# Patient Record
Sex: Female | Born: 1935 | Race: White | Hispanic: No | Smoking: Never smoker
Health system: Southern US, Community
[De-identification: ages and names within clinical notes are randomized; demographics above are authoritative.]

## PROBLEM LIST (undated history)

## (undated) DIAGNOSIS — I82409 Acute embolism and thrombosis of unspecified deep veins of unspecified lower extremity: Secondary | ICD-10-CM

## (undated) DIAGNOSIS — C4490 Unspecified malignant neoplasm of skin, unspecified: Secondary | ICD-10-CM

## (undated) DIAGNOSIS — I4891 Unspecified atrial fibrillation: Secondary | ICD-10-CM

## (undated) DIAGNOSIS — F0280 Dementia in other diseases classified elsewhere without behavioral disturbance: Secondary | ICD-10-CM

## (undated) DIAGNOSIS — M1990 Unspecified osteoarthritis, unspecified site: Secondary | ICD-10-CM

## (undated) DIAGNOSIS — I519 Heart disease, unspecified: Secondary | ICD-10-CM

## (undated) DIAGNOSIS — I10 Essential (primary) hypertension: Secondary | ICD-10-CM

## (undated) DIAGNOSIS — C50919 Malignant neoplasm of unspecified site of unspecified female breast: Secondary | ICD-10-CM

## (undated) HISTORY — PX: FACIAL COSMETIC SURGERY: SHX629

## (undated) HISTORY — DX: Essential (primary) hypertension: I10

## (undated) HISTORY — DX: Acute embolism and thrombosis of unspecified deep veins of unspecified lower extremity: I82.409

## (undated) HISTORY — DX: Heart disease, unspecified: I51.9

## (undated) HISTORY — PX: MASTECTOMY: SHX3

## (undated) HISTORY — DX: Unspecified osteoarthritis, unspecified site: M19.90

## (undated) HISTORY — PX: ABDOMINAL HYSTERECTOMY: SUR658

## (undated) HISTORY — PX: TONSILLECTOMY: SUR1361

## (undated) HISTORY — DX: Unspecified atrial fibrillation: I48.91

## (undated) HISTORY — PX: APPENDECTOMY: SHX54

---

## 2004-08-13 ENCOUNTER — Ambulatory Visit: Payer: Self-pay | Admitting: Unknown Physician Specialty

## 2004-08-20 ENCOUNTER — Ambulatory Visit: Payer: Self-pay | Admitting: Unknown Physician Specialty

## 2004-08-26 ENCOUNTER — Ambulatory Visit: Payer: Self-pay | Admitting: Unknown Physician Specialty

## 2004-09-04 ENCOUNTER — Ambulatory Visit: Payer: Self-pay | Admitting: Unknown Physician Specialty

## 2004-10-28 ENCOUNTER — Ambulatory Visit: Payer: Self-pay | Admitting: Family Medicine

## 2005-03-27 ENCOUNTER — Other Ambulatory Visit: Payer: Self-pay

## 2005-03-27 ENCOUNTER — Emergency Department: Payer: Self-pay | Admitting: Internal Medicine

## 2005-08-18 ENCOUNTER — Ambulatory Visit: Payer: Self-pay | Admitting: Gastroenterology

## 2005-10-29 ENCOUNTER — Ambulatory Visit: Payer: Self-pay | Admitting: Family Medicine

## 2006-12-23 ENCOUNTER — Other Ambulatory Visit: Payer: Self-pay

## 2006-12-23 ENCOUNTER — Inpatient Hospital Stay: Payer: Self-pay | Admitting: Specialist

## 2007-02-27 ENCOUNTER — Emergency Department: Payer: Self-pay | Admitting: Emergency Medicine

## 2007-02-27 ENCOUNTER — Other Ambulatory Visit: Payer: Self-pay

## 2007-05-03 ENCOUNTER — Ambulatory Visit: Payer: Self-pay | Admitting: Family Medicine

## 2008-01-07 DIAGNOSIS — C50919 Malignant neoplasm of unspecified site of unspecified female breast: Secondary | ICD-10-CM

## 2008-01-07 HISTORY — DX: Malignant neoplasm of unspecified site of unspecified female breast: C50.919

## 2008-04-11 ENCOUNTER — Emergency Department: Payer: Self-pay | Admitting: Emergency Medicine

## 2008-05-23 ENCOUNTER — Ambulatory Visit: Payer: Self-pay | Admitting: Family Medicine

## 2008-06-06 ENCOUNTER — Ambulatory Visit: Payer: Self-pay | Admitting: Oncology

## 2008-06-13 ENCOUNTER — Ambulatory Visit: Payer: Self-pay | Admitting: Surgery

## 2008-06-20 ENCOUNTER — Ambulatory Visit: Payer: Self-pay | Admitting: Surgery

## 2008-07-03 ENCOUNTER — Ambulatory Visit: Payer: Self-pay | Admitting: Oncology

## 2008-07-06 ENCOUNTER — Ambulatory Visit: Payer: Self-pay | Admitting: Oncology

## 2008-07-14 ENCOUNTER — Ambulatory Visit: Payer: Self-pay | Admitting: Vascular Surgery

## 2008-08-06 ENCOUNTER — Ambulatory Visit: Payer: Self-pay | Admitting: Oncology

## 2008-09-06 ENCOUNTER — Ambulatory Visit: Payer: Self-pay | Admitting: Oncology

## 2008-10-06 ENCOUNTER — Ambulatory Visit: Payer: Self-pay | Admitting: Oncology

## 2008-11-06 ENCOUNTER — Ambulatory Visit: Payer: Self-pay | Admitting: Oncology

## 2008-12-06 ENCOUNTER — Ambulatory Visit: Payer: Self-pay | Admitting: Oncology

## 2009-01-06 ENCOUNTER — Ambulatory Visit: Payer: Self-pay | Admitting: Oncology

## 2009-02-06 ENCOUNTER — Ambulatory Visit: Payer: Self-pay | Admitting: Oncology

## 2009-03-06 ENCOUNTER — Ambulatory Visit: Payer: Self-pay | Admitting: Oncology

## 2009-03-08 ENCOUNTER — Ambulatory Visit: Payer: Self-pay | Admitting: Oncology

## 2009-04-06 ENCOUNTER — Ambulatory Visit: Payer: Self-pay | Admitting: Oncology

## 2009-06-06 ENCOUNTER — Ambulatory Visit: Payer: Self-pay | Admitting: Internal Medicine

## 2009-06-07 ENCOUNTER — Ambulatory Visit: Payer: Self-pay | Admitting: Oncology

## 2009-07-06 ENCOUNTER — Ambulatory Visit: Payer: Self-pay | Admitting: Oncology

## 2009-07-06 ENCOUNTER — Ambulatory Visit: Payer: Self-pay | Admitting: Internal Medicine

## 2009-09-06 ENCOUNTER — Ambulatory Visit: Payer: Self-pay | Admitting: Oncology

## 2009-09-11 ENCOUNTER — Ambulatory Visit: Payer: Self-pay | Admitting: Oncology

## 2009-10-06 ENCOUNTER — Ambulatory Visit: Payer: Self-pay | Admitting: Oncology

## 2009-11-21 ENCOUNTER — Observation Stay: Payer: Self-pay | Admitting: Internal Medicine

## 2009-12-03 ENCOUNTER — Ambulatory Visit: Payer: Self-pay | Admitting: Oncology

## 2010-03-12 ENCOUNTER — Ambulatory Visit: Payer: Self-pay | Admitting: Oncology

## 2010-03-14 ENCOUNTER — Ambulatory Visit: Payer: Self-pay | Admitting: Oncology

## 2010-04-07 ENCOUNTER — Ambulatory Visit: Payer: Self-pay | Admitting: Oncology

## 2010-08-29 ENCOUNTER — Ambulatory Visit: Payer: Self-pay | Admitting: Oncology

## 2010-09-07 ENCOUNTER — Ambulatory Visit: Payer: Self-pay | Admitting: Oncology

## 2010-12-05 ENCOUNTER — Ambulatory Visit: Payer: Self-pay | Admitting: Oncology

## 2011-03-02 IMAGING — CR DG CHEST 1V PORT
1 series · 1 of 1 positions shown · non-contrast
Comparison: none

REASON FOR EXAM: palpatations
COMMENTS:

[view not recorded]
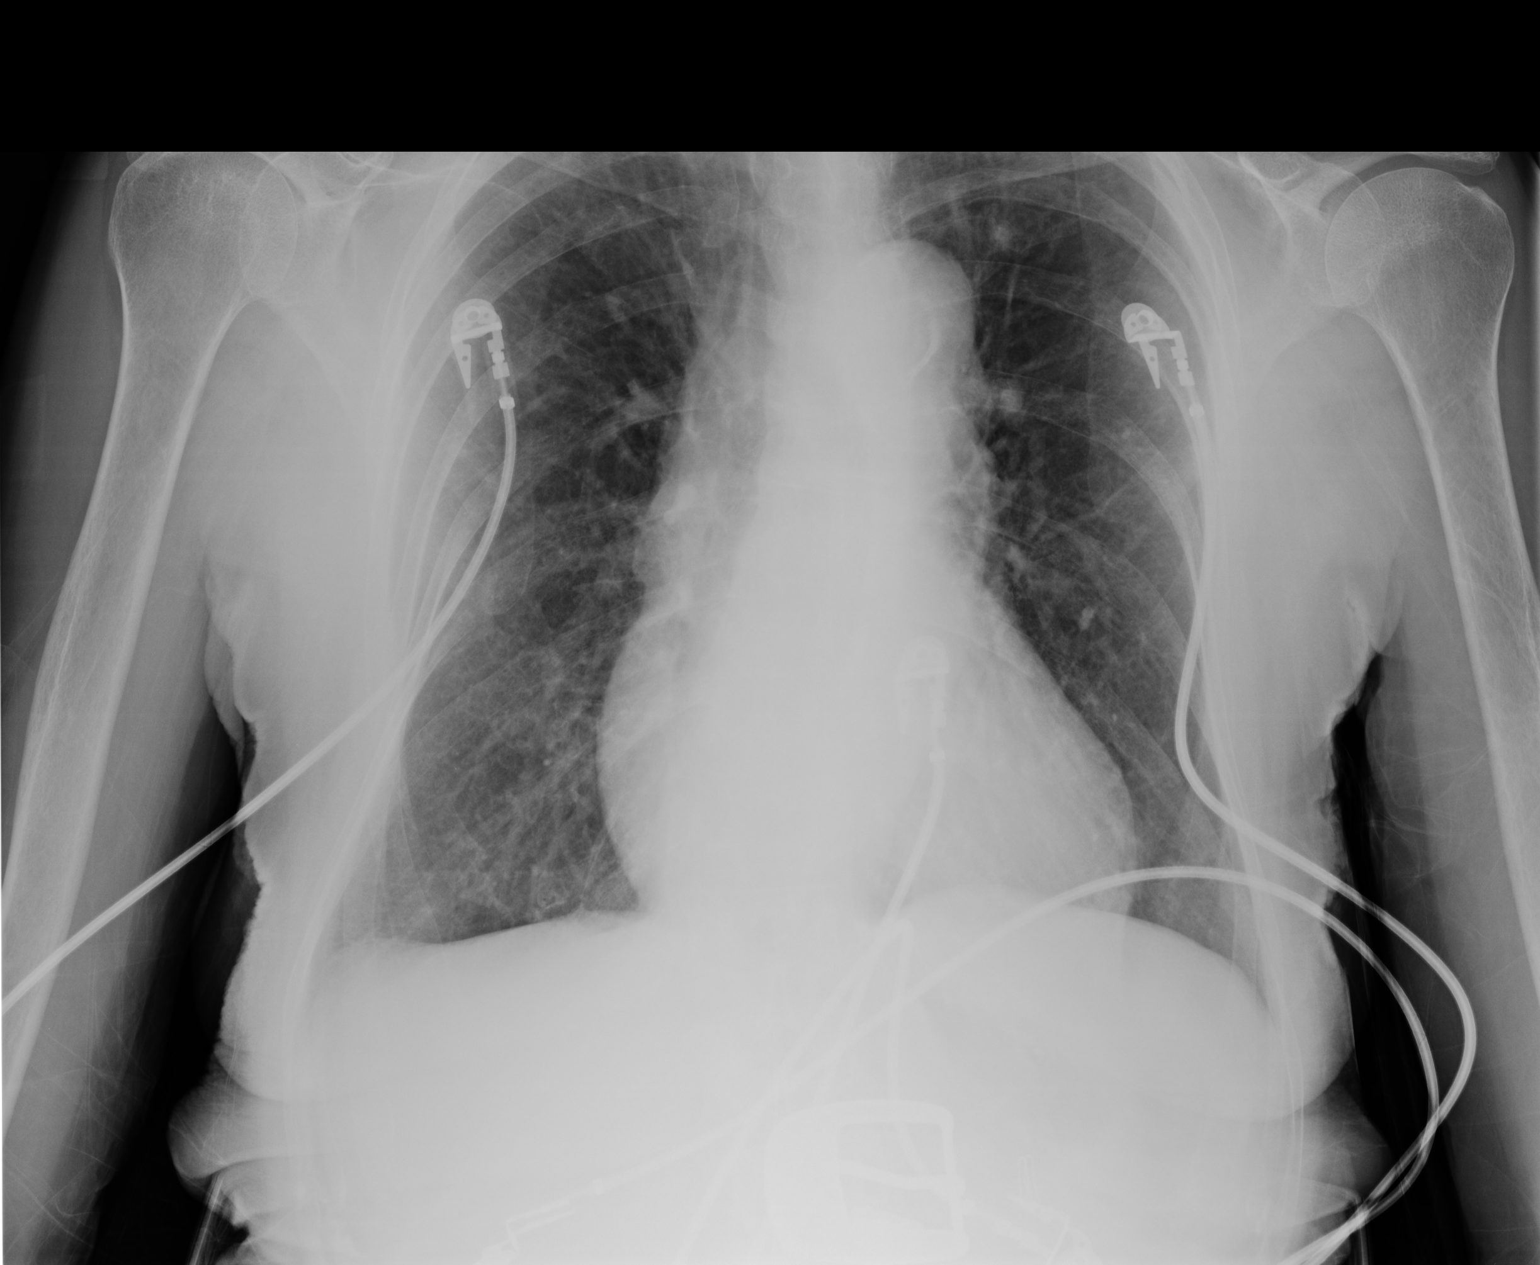

[1 of 1 positions shown; findings below may reference images not displayed]

PROCEDURE:     DXR - DXR PORTABLE CHEST SINGLE VIEW  - April 11, 2008  [DATE]

RESULT:     Comparison is made to the prior exam of 02/27/2007. There is
fibrotic change in the upper lobes bilaterally. There is thickening of the
interstitial lung markings suspicious for minimal interstitial edema. The
appearance suggests early or low grade CHF superimposed on COPD. Correlation
with clinical findings is needed. The heart is mildly enlarged but stable in
size as compared to the prior exam. Monitoring electrodes are present.
IMPRESSION: 1. No pneumonia is seen.
2. Possible early or low grade CHF superimposed on COPD.

## 2011-03-04 ENCOUNTER — Ambulatory Visit: Payer: Self-pay | Admitting: Oncology

## 2011-03-07 ENCOUNTER — Ambulatory Visit: Payer: Self-pay | Admitting: Oncology

## 2011-04-07 ENCOUNTER — Ambulatory Visit: Payer: Self-pay | Admitting: Oncology

## 2011-09-01 ENCOUNTER — Ambulatory Visit: Payer: Self-pay | Admitting: Oncology

## 2011-09-07 ENCOUNTER — Ambulatory Visit: Payer: Self-pay | Admitting: Oncology

## 2011-12-01 ENCOUNTER — Ambulatory Visit: Payer: Self-pay | Admitting: Family Medicine

## 2011-12-08 ENCOUNTER — Ambulatory Visit: Payer: Self-pay | Admitting: Oncology

## 2012-03-06 ENCOUNTER — Ambulatory Visit: Payer: Self-pay | Admitting: Oncology

## 2012-04-06 ENCOUNTER — Ambulatory Visit: Payer: Self-pay | Admitting: Oncology

## 2012-06-16 ENCOUNTER — Ambulatory Visit: Payer: Self-pay | Admitting: Surgery

## 2012-09-27 ENCOUNTER — Ambulatory Visit: Payer: Self-pay | Admitting: Oncology

## 2012-10-06 ENCOUNTER — Ambulatory Visit: Payer: Self-pay | Admitting: Oncology

## 2012-12-08 ENCOUNTER — Ambulatory Visit: Payer: Self-pay | Admitting: Oncology

## 2013-01-10 ENCOUNTER — Ambulatory Visit: Payer: Self-pay

## 2013-04-01 ENCOUNTER — Ambulatory Visit: Payer: Self-pay | Admitting: Oncology

## 2013-04-06 ENCOUNTER — Ambulatory Visit: Payer: Self-pay | Admitting: Oncology

## 2013-10-07 ENCOUNTER — Ambulatory Visit: Payer: Self-pay | Admitting: Oncology

## 2013-11-06 ENCOUNTER — Ambulatory Visit: Payer: Self-pay | Admitting: Oncology

## 2013-12-06 ENCOUNTER — Ambulatory Visit: Payer: Self-pay | Admitting: Oncology

## 2014-01-06 ENCOUNTER — Ambulatory Visit: Payer: Self-pay | Admitting: Oncology

## 2015-07-06 ENCOUNTER — Other Ambulatory Visit: Payer: Self-pay | Admitting: Family Medicine

## 2015-07-06 DIAGNOSIS — Z1231 Encounter for screening mammogram for malignant neoplasm of breast: Secondary | ICD-10-CM

## 2015-07-26 ENCOUNTER — Ambulatory Visit: Payer: Self-pay

## 2015-08-13 ENCOUNTER — Other Ambulatory Visit: Payer: Self-pay | Admitting: Family Medicine

## 2015-08-13 ENCOUNTER — Ambulatory Visit
Admission: RE | Admit: 2015-08-13 | Discharge: 2015-08-13 | Disposition: A | Discharge status: Home / Self Care | Payer: Medicare Other | Source: Ambulatory Visit | Attending: Family Medicine | Admitting: Family Medicine

## 2015-08-13 DIAGNOSIS — Z1231 Encounter for screening mammogram for malignant neoplasm of breast: Secondary | ICD-10-CM

## 2015-08-13 HISTORY — DX: Malignant neoplasm of unspecified site of unspecified female breast: C50.919

## 2015-08-13 HISTORY — DX: Unspecified malignant neoplasm of skin, unspecified: C44.90

## 2015-11-23 ENCOUNTER — Emergency Department
Admission: EM | Admit: 2015-11-23 | Discharge: 2015-11-23 | Disposition: A | Discharge status: Home / Self Care | Payer: Medicare Other | Attending: Emergency Medicine | Admitting: Emergency Medicine

## 2015-11-23 ENCOUNTER — Emergency Department: Payer: Medicare Other

## 2015-11-23 DIAGNOSIS — Z5181 Encounter for therapeutic drug level monitoring: Secondary | ICD-10-CM | POA: Diagnosis not present

## 2015-11-23 DIAGNOSIS — Z853 Personal history of malignant neoplasm of breast: Secondary | ICD-10-CM | POA: Diagnosis not present

## 2015-11-23 DIAGNOSIS — Z85828 Personal history of other malignant neoplasm of skin: Secondary | ICD-10-CM | POA: Diagnosis not present

## 2015-11-23 DIAGNOSIS — J069 Acute upper respiratory infection, unspecified: Secondary | ICD-10-CM | POA: Insufficient documentation

## 2015-11-23 DIAGNOSIS — R509 Fever, unspecified: Secondary | ICD-10-CM | POA: Diagnosis present

## 2015-11-23 LAB — CBC
HEMATOCRIT: 37.9 % (ref 35.0–47.0)
HEMOGLOBIN: 13.1 g/dL (ref 12.0–16.0)
MCH: 30.1 pg (ref 26.0–34.0)
MCHC: 34.6 g/dL (ref 32.0–36.0)
MCV: 86.8 fL (ref 80.0–100.0)
Platelets: 223 10*3/uL (ref 150–440)
RBC: 4.37 MIL/uL (ref 3.80–5.20)
RDW: 13.4 % (ref 11.5–14.5)
WBC: 6.5 10*3/uL (ref 3.6–11.0)

## 2015-11-23 LAB — BASIC METABOLIC PANEL
ANION GAP: 6 (ref 5–15)
BUN: 21 mg/dL — ABNORMAL HIGH (ref 6–20)
CHLORIDE: 103 mmol/L (ref 101–111)
CO2: 29 mmol/L (ref 22–32)
CREATININE: 0.87 mg/dL (ref 0.44–1.00)
Calcium: 9.3 mg/dL (ref 8.9–10.3)
GFR calc non Af Amer: 57 mL/min — ABNORMAL LOW (ref >60)
Glucose, Bld: 109 mg/dL — ABNORMAL HIGH (ref 65–99)
POTASSIUM: 4 mmol/L (ref 3.5–5.1)
SODIUM: 138 mmol/L (ref 135–145)

## 2015-11-23 LAB — PROTIME-INR
INR: 1.42
PROTHROMBIN TIME: 17.5 s — AB (ref 11.4–15.2)

## 2015-11-23 LAB — POCT RAPID STREP A: STREPTOCOCCUS, GROUP A SCREEN (DIRECT): NEGATIVE

## 2015-11-23 MED ORDER — AMOXICILLIN 500 MG PO CAPS
500.0000 mg | ORAL_CAPSULE | Freq: Once | ORAL | Status: AC
  Administered 2015-11-23: 500 mg via ORAL
  Filled 2015-11-23: qty 1

## 2015-11-23 MED ORDER — AMOXICILLIN 500 MG PO CAPS: 500.0000 mg | ORAL_CAPSULE | Freq: Three times a day (TID) | ORAL | 0 refills | Status: DC

## 2015-11-23 NOTE — ED Triage Notes (Signed)
Pt in with co sore throat and congestion since last Thursday. Pt also had bloody nose today, pt is on coumadin due to afib. Pt has also had a cough, no distress noted at this time.

## 2015-11-23 NOTE — Discharge Instructions (Signed)
1. Take antibiotic as prescribed (amoxicillin 500 mg 3 times daily 7 days). 2. Return to the ER for worsening symptoms, persistent vomiting, difficulty breathing or other concerns.

## 2015-11-23 NOTE — ED Provider Notes (Signed)
[facility] Emergency Department Provider Note   ____________________________________________   First MD Initiated Contact with Patient 11/23/15 [phone]     (approximate)  I have reviewed the triage vital signs and the nursing notes.   HISTORY  Chief Complaint Fever    HPI Jacqueline Larsen is a 82 y.o. female who presents to the ED from home with a chief complaint of cold-like symptoms. Patient reports sore throat, congestion, cough productive of yellow sputum 1 week. Also reports intermittent nosebleeds. Patient is on Coumadin for atrial fibrillation. Denies associated fever, chills, chest pain, shortness of breath, abdominal pain, nausea, vomiting, dysuria, diarrhea.Denies recent travel or trauma. + sick contacts. Nothing makes her symptoms better or worse.   Past Medical History:  Diagnosis Date  . Breast cancer ([place]) 2010   RT MASTECTOMY  . Skin cancer     There are no active problems to display for this patient.   No past surgical history on file.  Prior to Admission medications   Medication Sig Start Date End Date Taking? Authorizing Provider  amoxicillin (AMOXIL) 500 MG capsule Take 1 capsule (500 mg total) by mouth 3 (three) times daily. 11/23/15   Paulette Blanch, MD    Allergies Patient has no known allergies.  Family History  Problem Relation Age of Onset  . Breast cancer Mother 89  . Breast cancer Daughter     39'S    Social History Social History  Substance Use Topics  . Smoking status: Not on file  . Smokeless tobacco: Not on file  . Alcohol use Not on file    Review of Systems  Constitutional: No fever/chills. Eyes: No visual changes. ENT: Positive for sore throat. Cardiovascular: Denies chest pain. Respiratory: Positive for productive cough and congestion. Denies shortness of breath. Gastrointestinal: No abdominal pain.  No nausea, no vomiting.  No diarrhea.  No constipation. Genitourinary: Negative for  dysuria. Musculoskeletal: Negative for back pain. Skin: Negative for rash. Neurological: Negative for headaches, focal weakness or numbness.  10-point ROS otherwise negative.  ____________________________________________   PHYSICAL EXAM:  VITAL SIGNS: ED Triage Vitals [11/23/15 0046]  Enc Vitals Group     BP (!) 148/75     Pulse Rate 81     Resp 18     Temp 98.1 F (36.7 C)     Temp Source Oral     SpO2 98 %     Weight 115 lb (52.2 kg)     Height      Head Circumference      Peak Flow      Pain Score      Pain Loc      Pain Edu?      Excl. in [place]?     Constitutional: Alert and oriented. Well appearing and in no acute distress. Eyes: Conjunctivae are normal. PERRL. EOMI. Head: Atraumatic. Nose: Congestion/rhinnorhea. No bleeding. Mouth/Throat: Mucous membranes are moist.  Oropharynx mildly erythematous without tonsillar exudates, swelling or peritonsillar abscess. Neck: No stridor.  Supple neck without meningismus. Hematological/Lymphatic/Immunilogical: [place] anterior cervical lymphadenopathy. Cardiovascular: Normal rate, regular rhythm. Grossly normal heart sounds.  Good peripheral circulation. Respiratory: Normal respiratory effort.  No retractions. Lungs CTAB. No wheezing. Gastrointestinal: Soft and nontender. No distention. No abdominal bruits. No CVA tenderness. Musculoskeletal: No lower extremity tenderness nor edema.  No joint effusions. Neurologic:  Normal speech and language. No gross focal neurologic deficits are appreciated. No gait instability. Skin:  Skin is warm, dry and intact. No rash noted. Psychiatric:  Mood and affect are normal. Speech and behavior are normal.  ____________________________________________   LABS (all labs ordered are listed, but only abnormal results are displayed)  Labs Reviewed  BASIC METABOLIC PANEL - Abnormal; Notable for the following:       Result Value   Glucose, Bld 109 (*)    BUN 21 (*)    GFR calc non Af Amer 57 (*)     All other components within normal limits  PROTIME-INR - Abnormal; Notable for the following:    Prothrombin Time 17.5 (*)    All other components within normal limits  CBC  POCT RAPID STREP A   ____________________________________________  EKG  None ____________________________________________  RADIOLOGY  Chest 2 view (viewed by me, interpreted per Dr. Radene Knee): Findings of COPD. Lungs otherwise grossly clear. ____________________________________________   PROCEDURES  Procedure(s) performed: None  Procedures  Critical Care performed: No  ____________________________________________   INITIAL IMPRESSION / ASSESSMENT AND PLAN / ED COURSE  Pertinent labs & imaging results that were available during my care of the patient were reviewed by me and considered in my medical decision making (see chart for details).  82 year old female who presents with upper respiratory infection. She is afebrile with normal WBC, not tachypneic nor hypoxic. Does not meet SIRS or sepsis criteria. Will initiate treatment with amoxicillin and patient will follow-up closely with her PCP. Strict return precautions given. Patient and daughter verbalize understanding and agree with plan of care.  Clinical Course      ____________________________________________   FINAL CLINICAL IMPRESSION(S) / ED DIAGNOSES  Final diagnoses:  Upper respiratory tract infection, unspecified type      NEW MEDICATIONS STARTED DURING THIS VISIT:  Discharge Medication List as of 11/23/2015  5:56 AM    START taking these medications   Details  amoxicillin (AMOXIL) 500 MG capsule Take 1 capsule (500 mg total) by mouth 3 (three) times daily., Starting Fri 11/23/2015, Print         Note:  This document was prepared using Dragon voice recognition software and may include unintentional dictation errors.    Paulette Blanch, MD 11/23/15 [phone]

## 2016-06-24 NOTE — Progress Notes (Signed)
06/25/2016 9:05 AM   Jacqueline Larsen 01-Jul-1924 [id number]  Referring provider: Maryland Pink, MD [address] [facility] [place], [place] [address]  Chief Complaint  Patient presents with  . New Patient (Initial Visit)    urinary incontinence referred by Dr. Kary Kos    HPI: Patient is a 83 -year-old Caucasian female who is referred to [place] by, Dr. Kary Kos, for urinary incontinence with her daughter, Doloris.    Patient states that she has had urinary incontinence for one month and a half.    Patient has incontinence with stress and urge.   She is experiencing 3 incontinent episodes during the day. She is experiencing 1 to 2 incontinent episodes during the night.  Her incontinence volume is moderate.   She is wearing 3 pads/depends daily.    She is having associated urinary frequency x 4, urgency, nocturia x 1-2, intermittency and weak urinary stream.     She does not have a history of urinary tract infections, STI's or injury to the bladder.  She denies dysuria, gross hematuria, suprapubic pain, back pain, abdominal pain or flank pain.  She has not had any recent fevers, chills, nausea or vomiting.   She does not have a history of nephrolithiasis, GU surgery or GU trauma.   She is sexually active.  She is post menopausal.   She denies constipation and/or diarrhea.  She is not having pain with bladder filling.  She has not had any recent imaging studies.    She is drinking 2 bottles of water daily.   She is not drinking  caffeinated beverages daily.  She is not drinking alcoholic beverages daily.    Her risk factors for incontinence are age, caffeine, vaginal atrophy and  pelvic surgery,   She is taking antihistamines, benzo's, OAB medication, ACE inhibitors, alpha-agonists, alpha blockers, antiarrhythmics and diuretics.  Her PVR is 0 mL.    PMH: Past Medical History:  Diagnosis Date  . Arthritis   . Atrial fibrillation ([place])   . Breast cancer ([place]) 2010    RT MASTECTOMY  . DVT (deep venous thrombosis) ([place])   . Heart disease   . HTN (hypertension)   . Skin cancer     Surgical History: Past Surgical History:  Procedure Laterality Date  . ABDOMINAL HYSTERECTOMY    . APPENDECTOMY    . FACIAL COSMETIC SURGERY     removed nodule off face  . MASTECTOMY Right    Breast cancer  . TONSILLECTOMY      Home Medications:  Allergies as of 06/25/2016      Reactions   Clarithromycin Other (See Comments)   Doxycycline Other (See Comments)   Erythromycin Other (See Comments)   Sulindac Other (See Comments)      Medication List       Accurate as of 06/25/16 11:59 PM. Always use your most recent med list.          amoxicillin 500 MG capsule Commonly known as:  AMOXIL Take 1 capsule (500 mg total) by mouth 3 (three) times daily.   digoxin 0.125 MG tablet Commonly known as:  LANOXIN   diltiazem 120 MG 24 hr capsule Commonly known as:  CARDIZEM CD Take 120 mg by mouth.   donepezil 5 MG tablet Commonly known as:  ARICEPT Take 1 tablet once daily   levothyroxine 50 MCG tablet Commonly known as:  SYNTHROID, LEVOTHROID TAKE 1 TABLET BY MOUTH ONCE A DAY   lisinopril 10 MG tablet Commonly known  as:  PRINIVIL,ZESTRIL TAKE 1 TABLET BY MOUTH ONCE A DAY   meclizine 25 MG tablet Commonly known as:  ANTIVERT Take by mouth.   metoprolol succinate 25 MG 24 hr tablet Commonly known as:  TOPROL-XL Take by mouth.   metoprolol tartrate 25 MG tablet Commonly known as:  LOPRESSOR Take 25 mg by mouth.   oxybutynin 5 MG tablet Commonly known as:  DITROPAN Take by mouth.   Vitamin B-12 1000 MCG Subl Take by mouth.   warfarin 4 MG tablet Commonly known as:  COUMADIN TAKE 1 TABLET BY MOUTH EVERY NIGHT       Allergies:  Allergies  Allergen Reactions  . Clarithromycin Other (See Comments)  . Doxycycline Other (See Comments)  . Erythromycin Other (See Comments)  . Sulindac Other (See Comments)    Family History: Family  History  Problem Relation Age of Onset  . Breast cancer Mother 26  . Breast cancer Daughter        4'S  . Kidney cancer Neg Hx   . Bladder Cancer Neg Hx     Social History:  reports that she has never smoked. She has never used smokeless tobacco. She reports that she does not drink alcohol or use drugs.  ROS: UROLOGY Frequent Urination?: Yes Hard to postpone urination?: Yes Burning/pain with urination?: No Get up at night to urinate?: Yes Leakage of urine?: Yes Urine stream starts and stops?: Yes Trouble starting stream?: No Do you have to strain to urinate?: No Blood in urine?: No Urinary tract infection?: No Sexually transmitted disease?: No Injury to kidneys or bladder?: No Painful intercourse?: No Weak stream?: Yes Currently pregnant?: No Vaginal bleeding?: No Last menstrual period?: n  Gastrointestinal Nausea?: Yes Vomiting?: No Indigestion/heartburn?: Yes Diarrhea?: No Constipation?: No  Constitutional Fever: No Night sweats?: No Weight loss?: No Fatigue?: Yes  Skin Skin rash/lesions?: No Itching?: No  Eyes Blurred vision?: No Double vision?: No  Ears/Nose/Throat Sore throat?: No Sinus problems?: No  Hematologic/Lymphatic Swollen glands?: No Easy bruising?: Yes  Cardiovascular Leg swelling?: Yes Chest pain?: No  Respiratory Cough?: Yes Shortness of breath?: No  Endocrine Excessive thirst?: No  Musculoskeletal Back pain?: Yes Joint pain?: Yes  Neurological Headaches?: No Dizziness?: Yes  Psychologic Depression?: No Anxiety?: No  Physical Exam: BP 122/73   Pulse 80   Ht 5\' 5"  (1.651 m)   Wt 119 lb 11.2 oz (54.3 kg)   BMI 19.92 kg/m   Constitutional: Well nourished. Alert and oriented, No acute distress. HEENT: [place] AT, moist mucus membranes. Trachea midline, no masses. Cardiovascular: No clubbing, cyanosis, or edema. Respiratory: Normal respiratory effort, no increased work of breathing. GI: Abdomen is soft, non  tender, non distended, no abdominal masses. Liver and spleen not palpable.  No hernias appreciated.  Stool sample for occult testing is not indicated.   GU: No CVA tenderness.  No bladder fullness or masses.  Atrophic external genitalia, normal pubic hair distribution, no lesions.  Normal urethral meatus, no lesions, no prolapse, no discharge.   No urethral masses, tenderness and/or tenderness. No bladder fullness, tenderness or masses. Pale vagina mucosa, poor estrogen effect, no discharge, no lesions, good pelvic support, Grade I cystocele. No rectocele noted.  Cervix, uterus and adnexa are surgically absent. Anus and perineum are without rashes or lesions.    Skin: No rashes, bruises or suspicious lesions. Lymph: No cervical or inguinal adenopathy. Neurologic: Grossly intact, no focal deficits, moving all 4 extremities. Psychiatric: Normal mood and affect.  Laboratory Data: Lab Results  Component Value Date   WBC 6.5 11/23/2015   HGB 13.1 11/23/2015   HCT 37.9 11/23/2015   MCV 86.8 11/23/2015   PLT 223 11/23/2015    Lab Results  Component Value Date   CREATININE 0.87 11/23/2015      Pertinent Imaging: Results for MARGEE, TRENTHAM (MRN [id number]) as of 06/25/2016 14:29  Ref. Range 06/25/2016 14:14  Scan Result Unknown 0    Assessment & Plan:    1. Urge Incontinence  - Discussed behavioral therapies, bladder training and bladder control strategies  - pelvic floor muscle training - patient has memory issues so she is not a good candidate for PT  - offered medical therapy with anticholinergic therapy or beta-3 adrenergic receptor agonist and the potential side effects of each therapy - she is not wanting to take medication for her incontinence  - offered PTNS therapy  - explained the PTNS provides treatment by indirectly providing electrical stimulation to the nerves responsible for bladder and pelvic floor function - a needle electrode generates an adjustable electrical pulse  that travels to the sacral plexus via the tibial nerve which is located in the ankle, among other functions, the sacral nerve plexus regulates bladder and pelvic floor function - treatment protocol requires once-a-week treatments for 12 weeks, 30 minutes per session and many patients begin to see improvements by the 6th treatment. Patients who respond to treatment may require occasional treatments (~ once every 3 weeks) to sustain improvements. PTNS is a low-risk procedure. The most common side-effects with PTNS treatment are temporary and minor, resulting from the placement of the needle electrode. They include minor bleeding, mild pain and skin inflammation and patients have seen up to an 80% success rate with this form of treatment  - After researching her insurance she finds her co-pays cost prohibitive  - They will try Myrbetriq 50 mg samples  - RTC in 3 weeks for OAB questionnaire and PVR  2. Vaginal atrophy  - Patient does not want to use products containing estrogen as she has a history of breast cancer  Return in about 3 weeks (around 07/16/2016) for PVR and OAB questionnaire.  These notes generated with voice recognition software. I apologize for typographical errors.  Zara Council, [facility] [address], [place], [place] [address] [phone]

## 2016-06-25 ENCOUNTER — Ambulatory Visit (INDEPENDENT_AMBULATORY_CARE_PROVIDER_SITE_OTHER): Payer: Medicare Other | Admitting: Urology

## 2016-06-25 ENCOUNTER — Telehealth: Payer: Self-pay | Admitting: *Deleted

## 2016-06-25 ENCOUNTER — Encounter: Payer: Self-pay | Admitting: Urology

## 2016-06-25 VITALS — BP 122/73 | HR 80 | Ht 65.0 in | Wt 119.7 lb

## 2016-06-25 DIAGNOSIS — N952 Postmenopausal atrophic vaginitis: Secondary | ICD-10-CM

## 2016-06-25 DIAGNOSIS — N3941 Urge incontinence: Secondary | ICD-10-CM | POA: Diagnosis not present

## 2016-06-25 LAB — BLADDER SCAN AMB NON-IMAGING: Scan Result: 0

## 2016-06-25 NOTE — Progress Notes (Signed)
Spoke with patient in regards to Larene Beach wanting to try PTNS procedure. Patient states this would not work with her budget. She states she can't pay 50.00 co payment every week for twelve weeks. Zara Council PA-C informed.

## 2016-06-25 NOTE — Telephone Encounter (Signed)
Called patient to offer her Myrbetriq 51 after patient can't do PTNS because of cost. Larene Beach states patient can have samples for one month and if she does she will need an appointment made for 3 weeks for follow up.

## 2016-06-26 NOTE — Telephone Encounter (Signed)
Patients daughter called I gave her Shannons message and they would like to try the myrbetriq. Patient will set up three week appointment tomorrow when picking up samples.

## 2016-07-21 NOTE — Progress Notes (Signed)
07/22/2016 4:51 PM   Jacqueline Larsen 06/01/1924 [id number]  Referring provider: Maryland Pink, MD [address] [facility] [place], [place] [address]  Chief Complaint  Patient presents with  . Urinary Incontinence    Urge  3 week follow up     HPI: 83 yo WF who presents today for a 3 week follow up.  Background history Patient is a 73 -year-old Caucasian female who is referred to [place] by, Dr. Kary Kos, for urinary incontinence with her daughter, Rudene Re.  Patient states that she has had urinary incontinence for one month and a half.  Patient has incontinence with stress and urge.   She is experiencing 3 incontinent episodes during the day. She is experiencing 1 to 2 incontinent episodes during the night.  Her incontinence volume is moderate.   She is wearing 3 pads/depends daily.  She is having associated urinary frequency x 4, urgency, nocturia x 1-2, intermittency and weak urinary stream.   She does not have a history of urinary tract infections, STI's or injury to the bladder.  She denies dysuria, gross hematuria, suprapubic pain, back pain, abdominal pain or flank pain.  She has not had any recent fevers, chills, nausea or vomiting.   She does not have a history of nephrolithiasis, GU surgery or GU trauma.   She is sexually active.  She is post menopausal.  She denies constipation and/or diarrhea.  She is not having pain with bladder filling.  She has not had any recent imaging studies.  She is drinking 2 bottles of water daily.   She is not drinking  caffeinated beverages daily.  She is not drinking alcoholic beverages daily.  Her risk factors for incontinence are age, caffeine, vaginal atrophy and  pelvic surgery, She is taking antihistamines, benzo's, OAB medication, ACE inhibitors, alpha-agonists, alpha blockers, antiarrhythmics and diuretics.  Her PVR is 0 mL.    She was interested in PTNS therapy, but she could not afford the co-payments.  She was then given samples of  Myrbetriq 50 mg.  The patient has been experiencing urgency x 4-7, frequency x 4-7, not restricting fluids to avoid visits to the restroom, is engaging in toilet mapping, incontinence x 4-7  and nocturia x 0-3.  She is not having fevers, chills, nausea and vomiting.  She is not having gross hematuria, suprapubic pain or dysuria.  Her PVR was 0 mL.      She states she did not find the Myrbetriq effective, but she did note that the pain before urination eased off.  It has returned last night.  Her UA was unremarkable at today visit.    PMH: Past Medical History:  Diagnosis Date  . Arthritis   . Atrial fibrillation ([place])   . Breast cancer ([place]) 2010   RT MASTECTOMY  . DVT (deep venous thrombosis) ([place])   . Heart disease   . HTN (hypertension)   . Skin cancer     Surgical History: Past Surgical History:  Procedure Laterality Date  . ABDOMINAL HYSTERECTOMY    . APPENDECTOMY    . FACIAL COSMETIC SURGERY     removed nodule off face  . MASTECTOMY Right    Breast cancer  . TONSILLECTOMY      Home Medications:  Allergies as of 07/22/2016      Reactions   Clarithromycin Other (See Comments)   Doxycycline Other (See Comments)   Erythromycin Other (See Comments)   Sulindac Other (See Comments)  Medication List       Accurate as of 07/22/16  4:51 PM. Always use your most recent med list.          amoxicillin 500 MG capsule Commonly known as:  AMOXIL Take 1 capsule (500 mg total) by mouth 3 (three) times daily.   digoxin 0.125 MG tablet Commonly known as:  LANOXIN   diltiazem 120 MG 24 hr capsule Commonly known as:  CARDIZEM CD Take 120 mg by mouth.   donepezil 5 MG tablet Commonly known as:  ARICEPT Take 1 tablet once daily   levothyroxine 50 MCG tablet Commonly known as:  SYNTHROID, LEVOTHROID TAKE 1 TABLET BY MOUTH ONCE A DAY   lisinopril 10 MG tablet Commonly known as:  PRINIVIL,ZESTRIL TAKE 1 TABLET BY MOUTH ONCE A DAY   meclizine 25 MG tablet Commonly  known as:  ANTIVERT Take by mouth.   metoprolol succinate 25 MG 24 hr tablet Commonly known as:  TOPROL-XL Take by mouth.   metoprolol tartrate 25 MG tablet Commonly known as:  LOPRESSOR Take 25 mg by mouth.   mirabegron ER 50 MG Tb24 tablet Commonly known as:  MYRBETRIQ Take 1 tablet (50 mg total) by mouth daily.   oxybutynin 5 MG tablet Commonly known as:  DITROPAN Take by mouth.   Vitamin B-12 1000 MCG Subl Take by mouth.   warfarin 4 MG tablet Commonly known as:  COUMADIN TAKE 1 TABLET BY MOUTH EVERY NIGHT       Allergies:  Allergies  Allergen Reactions  . Clarithromycin Other (See Comments)  . Doxycycline Other (See Comments)  . Erythromycin Other (See Comments)  . Sulindac Other (See Comments)    Family History: Family History  Problem Relation Age of Onset  . Breast cancer Mother 86  . Breast cancer Daughter        38'S  . Kidney cancer Neg Hx   . Bladder Cancer Neg Hx     Social History:  reports that she has never smoked. She has never used smokeless tobacco. She reports that she does not drink alcohol or use drugs.  ROS: UROLOGY Frequent Urination?: No Hard to postpone urination?: Yes Burning/pain with urination?: No Get up at night to urinate?: Yes Leakage of urine?: Yes Urine stream starts and stops?: Yes Trouble starting stream?: Yes Do you have to strain to urinate?: No Blood in urine?: No Urinary tract infection?: No Sexually transmitted disease?: No Injury to kidneys or bladder?: No Painful intercourse?: No Weak stream?: Yes Currently pregnant?: No Vaginal bleeding?: No Last menstrual period?: n  Gastrointestinal Nausea?: No Vomiting?: No Indigestion/heartburn?: No Diarrhea?: No Constipation?: No  Constitutional Fever: No Night sweats?: No Weight loss?: No Fatigue?: Yes  Skin Skin rash/lesions?: No Itching?: Yes  Eyes Blurred vision?: No Double vision?: No  Ears/Nose/Throat Sore throat?: No Sinus problems?:  No  Hematologic/Lymphatic Swollen glands?: No Easy bruising?: Yes  Cardiovascular Leg swelling?: No Chest pain?: No  Respiratory Cough?: No Shortness of breath?: No  Endocrine Excessive thirst?: No  Musculoskeletal Back pain?: Yes Joint pain?: Yes  Neurological Headaches?: No Dizziness?: No  Psychologic Depression?: No Anxiety?: No  Physical Exam: BP 133/77   Pulse (!) 45   Ht 5\' 5"  (1.651 m)   Wt 118 lb 6.4 oz (53.7 kg)   BMI 19.70 kg/m   Constitutional: Well nourished. Alert and oriented, No acute distress. HEENT: [place] AT, moist mucus membranes. Trachea midline, no masses. Cardiovascular: No clubbing, cyanosis, or edema. Respiratory: Normal respiratory effort, no increased  work of breathing. GI: Abdomen is soft, non tender, non distended, no abdominal masses. Liver and spleen not palpable.  No hernias appreciated.  Stool sample for occult testing is not indicated.   GU: No CVA tenderness.  No bladder fullness or masses.  Atrophic external genitalia, normal pubic hair distribution, no lesions.  Normal urethral meatus, no lesions, no prolapse, no discharge.   No urethral masses, tenderness and/or tenderness. No bladder fullness, tenderness or masses. Pale vagina mucosa, poor estrogen effect, no discharge, no lesions, good pelvic support, Grade I cystocele. No rectocele noted.  Cervix, uterus and adnexa are surgically absent. Anus and perineum are without rashes or lesions.    Skin: No rashes, bruises or suspicious lesions. Lymph: No cervical or inguinal adenopathy. Neurologic: Grossly intact, no focal deficits, moving all 4 extremities. Psychiatric: Normal mood and affect.  Laboratory Data: Lab Results  Component Value Date   WBC 6.5 11/23/2015   HGB 13.1 11/23/2015   HCT 37.9 11/23/2015   MCV 86.8 11/23/2015   PLT 223 11/23/2015    Lab Results  Component Value Date   CREATININE 0.87 11/23/2015   I have reviewed the labs   Pertinent Imaging: Results  for KYLINA, VULTAGGIO (MRN [id number]) as of 07/22/2016 15:50  Ref. Range 07/22/2016 15:37  Scan Result Unknown 0    Assessment & Plan:    1. Urge Incontinence  - We discussed continuing the Mrybetriq 50 mg daily, changing to an anticholinergic or adding an anticholinergic to the Myrbetriq  - She would like to give the Myrbetriq more time and will continue the medication for 3 more months  - She will return in 3 months for OAB questionnaire and PVR  2. Dysuria  - patient's UA is negative - will send for culture for completeness   - will reassess when she returns in 3 months    3. Vaginal atrophy  - Patient does not want to use products containing estrogen as she has a history of breast cancer  Return in about 3 months (around 10/22/2016) for PVR and OAB questionnaire.  These notes generated with voice recognition software. I apologize for typographical errors.  Zara Council, [facility] [address], [place],  [address] [phone]

## 2016-07-22 ENCOUNTER — Encounter: Payer: Self-pay | Admitting: Urology

## 2016-07-22 ENCOUNTER — Ambulatory Visit (INDEPENDENT_AMBULATORY_CARE_PROVIDER_SITE_OTHER): Payer: Medicare Other | Admitting: Urology

## 2016-07-22 VITALS — BP 133/77 | HR 45 | Ht 65.0 in | Wt 118.4 lb

## 2016-07-22 DIAGNOSIS — N952 Postmenopausal atrophic vaginitis: Secondary | ICD-10-CM | POA: Diagnosis not present

## 2016-07-22 DIAGNOSIS — R300 Dysuria: Secondary | ICD-10-CM

## 2016-07-22 DIAGNOSIS — N3941 Urge incontinence: Secondary | ICD-10-CM | POA: Diagnosis not present

## 2016-07-22 LAB — BLADDER SCAN AMB NON-IMAGING: Scan Result: 0

## 2016-07-22 MED ORDER — MIRABEGRON ER 50 MG PO TB24
50.0000 mg | ORAL_TABLET | Freq: Every day | ORAL | 11 refills | Status: DC
Start: 2016-07-22 — End: 2017-02-11

## 2016-07-23 LAB — URINALYSIS, COMPLETE
BILIRUBIN UA: NEGATIVE
GLUCOSE, UA: NEGATIVE
KETONES UA: NEGATIVE
LEUKOCYTES UA: NEGATIVE
Nitrite, UA: NEGATIVE
RBC UA: NEGATIVE
Urobilinogen, Ur: 0.2 mg/dL (ref 0.2–1.0)
pH, UA: 6 (ref 5.0–7.5)

## 2016-07-23 LAB — MICROSCOPIC EXAMINATION

## 2016-07-26 LAB — CULTURE, URINE COMPREHENSIVE

## 2016-10-23 ENCOUNTER — Ambulatory Visit: Payer: Medicare Other | Admitting: Urology

## 2017-02-11 ENCOUNTER — Emergency Department: Payer: Medicare Other

## 2017-02-11 ENCOUNTER — Observation Stay
Admission: EM | Admit: 2017-02-11 | Discharge: 2017-02-11 | Disposition: A | Discharge status: Home / Self Care | Payer: Medicare Other | Attending: Internal Medicine | Admitting: Internal Medicine

## 2017-02-11 ENCOUNTER — Encounter: Payer: Self-pay | Admitting: Emergency Medicine

## 2017-02-11 ENCOUNTER — Other Ambulatory Visit: Payer: Self-pay

## 2017-02-11 DIAGNOSIS — Z85828 Personal history of other malignant neoplasm of skin: Secondary | ICD-10-CM | POA: Diagnosis not present

## 2017-02-11 DIAGNOSIS — I119 Hypertensive heart disease without heart failure: Secondary | ICD-10-CM | POA: Insufficient documentation

## 2017-02-11 DIAGNOSIS — Z79899 Other long term (current) drug therapy: Secondary | ICD-10-CM | POA: Diagnosis not present

## 2017-02-11 DIAGNOSIS — Z886 Allergy status to analgesic agent status: Secondary | ICD-10-CM | POA: Insufficient documentation

## 2017-02-11 DIAGNOSIS — Z79890 Hormone replacement therapy (postmenopausal): Secondary | ICD-10-CM | POA: Insufficient documentation

## 2017-02-11 DIAGNOSIS — Z853 Personal history of malignant neoplasm of breast: Secondary | ICD-10-CM | POA: Diagnosis not present

## 2017-02-11 DIAGNOSIS — Z86718 Personal history of other venous thrombosis and embolism: Secondary | ICD-10-CM | POA: Insufficient documentation

## 2017-02-11 DIAGNOSIS — Z9049 Acquired absence of other specified parts of digestive tract: Secondary | ICD-10-CM | POA: Insufficient documentation

## 2017-02-11 DIAGNOSIS — J101 Influenza due to other identified influenza virus with other respiratory manifestations: Secondary | ICD-10-CM | POA: Diagnosis present

## 2017-02-11 DIAGNOSIS — M1990 Unspecified osteoarthritis, unspecified site: Secondary | ICD-10-CM | POA: Diagnosis not present

## 2017-02-11 DIAGNOSIS — R4182 Altered mental status, unspecified: Secondary | ICD-10-CM | POA: Diagnosis present

## 2017-02-11 DIAGNOSIS — Z90710 Acquired absence of both cervix and uterus: Secondary | ICD-10-CM | POA: Diagnosis not present

## 2017-02-11 DIAGNOSIS — Z7901 Long term (current) use of anticoagulants: Secondary | ICD-10-CM | POA: Insufficient documentation

## 2017-02-11 DIAGNOSIS — Z803 Family history of malignant neoplasm of breast: Secondary | ICD-10-CM | POA: Insufficient documentation

## 2017-02-11 DIAGNOSIS — J111 Influenza due to unidentified influenza virus with other respiratory manifestations: Principal | ICD-10-CM | POA: Insufficient documentation

## 2017-02-11 DIAGNOSIS — Z9011 Acquired absence of right breast and nipple: Secondary | ICD-10-CM | POA: Diagnosis not present

## 2017-02-11 DIAGNOSIS — R2681 Unsteadiness on feet: Secondary | ICD-10-CM | POA: Insufficient documentation

## 2017-02-11 DIAGNOSIS — Z9181 History of falling: Secondary | ICD-10-CM | POA: Insufficient documentation

## 2017-02-11 DIAGNOSIS — I4891 Unspecified atrial fibrillation: Secondary | ICD-10-CM | POA: Diagnosis not present

## 2017-02-11 DIAGNOSIS — Z881 Allergy status to other antibiotic agents status: Secondary | ICD-10-CM | POA: Diagnosis not present

## 2017-02-11 DIAGNOSIS — R262 Difficulty in walking, not elsewhere classified: Secondary | ICD-10-CM | POA: Diagnosis not present

## 2017-02-11 LAB — INFLUENZA PANEL BY PCR (TYPE A & B)
Influenza A By PCR: POSITIVE — AB
Influenza B By PCR: NEGATIVE

## 2017-02-11 LAB — CBC
HCT: 37.5 % (ref 35.0–47.0)
Hemoglobin: 12.6 g/dL (ref 12.0–16.0)
MCH: 29.6 pg (ref 26.0–34.0)
MCHC: 33.5 g/dL (ref 32.0–36.0)
MCV: 88.1 fL (ref 80.0–100.0)
PLATELETS: 161 10*3/uL (ref 150–440)
RBC: 4.25 MIL/uL (ref 3.80–5.20)
RDW: 14.1 % (ref 11.5–14.5)
WBC: 4.3 10*3/uL (ref 3.6–11.0)

## 2017-02-11 LAB — URINALYSIS, ROUTINE W REFLEX MICROSCOPIC
Bilirubin Urine: NEGATIVE
Glucose, UA: NEGATIVE mg/dL
KETONES UR: NEGATIVE mg/dL
Nitrite: NEGATIVE
PH: 5 (ref 5.0–8.0)
PROTEIN: 30 mg/dL — AB
Specific Gravity, Urine: 1.024 (ref 1.005–1.030)

## 2017-02-11 LAB — BASIC METABOLIC PANEL
Anion gap: 10 (ref 5–15)
BUN: 25 mg/dL — ABNORMAL HIGH (ref 6–20)
CALCIUM: 9 mg/dL (ref 8.9–10.3)
CHLORIDE: 102 mmol/L (ref 101–111)
CO2: 25 mmol/L (ref 22–32)
CREATININE: 0.9 mg/dL (ref 0.44–1.00)
GFR calc Af Amer: >60 mL/min (ref >60)
GFR calc non Af Amer: 55 mL/min — ABNORMAL LOW (ref >60)
Glucose, Bld: 104 mg/dL — ABNORMAL HIGH (ref 65–99)
Potassium: 3.6 mmol/L (ref 3.5–5.1)
Sodium: 137 mmol/L (ref 135–145)

## 2017-02-11 LAB — COMPREHENSIVE METABOLIC PANEL
ALBUMIN: 4 g/dL (ref 3.5–5.0)
ALK PHOS: 64 U/L (ref 38–126)
ALT: 22 U/L (ref 14–54)
AST: 37 U/L (ref 15–41)
Anion gap: 12 (ref 5–15)
BILIRUBIN TOTAL: 0.6 mg/dL (ref 0.3–1.2)
BUN: 24 mg/dL — ABNORMAL HIGH (ref 6–20)
CALCIUM: 9.3 mg/dL (ref 8.9–10.3)
CO2: 25 mmol/L (ref 22–32)
Chloride: 98 mmol/L — ABNORMAL LOW (ref 101–111)
Creatinine, Ser: 1.02 mg/dL — ABNORMAL HIGH (ref 0.44–1.00)
GFR calc Af Amer: 54 mL/min — ABNORMAL LOW (ref >60)
GFR calc non Af Amer: 47 mL/min — ABNORMAL LOW (ref >60)
GLUCOSE: 105 mg/dL — AB (ref 65–99)
Potassium: 3.9 mmol/L (ref 3.5–5.1)
Sodium: 135 mmol/L (ref 135–145)
TOTAL PROTEIN: 7.5 g/dL (ref 6.5–8.1)

## 2017-02-11 LAB — CBC WITH DIFFERENTIAL/PLATELET
BASOS ABS: 0 10*3/uL (ref 0–0.1)
BASOS PCT: 0 %
EOS ABS: 0 10*3/uL (ref 0–0.7)
EOS PCT: 0 %
HEMATOCRIT: 40.3 % (ref 35.0–47.0)
Hemoglobin: 13.5 g/dL (ref 12.0–16.0)
Lymphocytes Relative: 16 %
Lymphs Abs: 0.8 10*3/uL — ABNORMAL LOW (ref 1.0–3.6)
MCH: 29.5 pg (ref 26.0–34.0)
MCHC: 33.6 g/dL (ref 32.0–36.0)
MCV: 88 fL (ref 80.0–100.0)
MONO ABS: 1 10*3/uL — AB (ref 0.2–0.9)
Monocytes Relative: 18 %
NEUTROS ABS: 3.4 10*3/uL (ref 1.4–6.5)
Neutrophils Relative %: 66 %
PLATELETS: 178 10*3/uL (ref 150–440)
RBC: 4.59 MIL/uL (ref 3.80–5.20)
RDW: 13.9 % (ref 11.5–14.5)
WBC: 5.2 10*3/uL (ref 3.6–11.0)

## 2017-02-11 LAB — PROTIME-INR
INR: 1.49
PROTHROMBIN TIME: 17.9 s — AB (ref 11.4–15.2)

## 2017-02-11 LAB — LACTIC ACID, PLASMA: Lactic Acid, Venous: 1.9 mmol/L (ref 0.5–1.9)

## 2017-02-11 MED ORDER — SODIUM CHLORIDE 0.9 % IV SOLN
INTRAVENOUS | Status: DC
  Administered 2017-02-11: 06:00:00 via INTRAVENOUS

## 2017-02-11 MED ORDER — LEVOTHYROXINE SODIUM 50 MCG PO TABS
50.0000 ug | ORAL_TABLET | Freq: Every day | ORAL | Status: DC
  Administered 2017-02-11: 50 ug via ORAL
  Filled 2017-02-11: qty 1

## 2017-02-11 MED ORDER — BENZONATATE 100 MG PO CAPS
100.0000 mg | ORAL_CAPSULE | Freq: Three times a day (TID) | ORAL | Status: DC
  Administered 2017-02-11: 100 mg via ORAL
  Filled 2017-02-11: qty 1

## 2017-02-11 MED ORDER — ONDANSETRON HCL 4 MG PO TABS: 4.0000 mg | ORAL_TABLET | Freq: Four times a day (QID) | ORAL | Status: DC | PRN

## 2017-02-11 MED ORDER — ACETAMINOPHEN 325 MG PO TABS: 650.0000 mg | ORAL_TABLET | Freq: Four times a day (QID) | ORAL | Status: DC | PRN

## 2017-02-11 MED ORDER — ACETAMINOPHEN 650 MG RE SUPP
650.0000 mg | Freq: Four times a day (QID) | RECTAL | Status: DC | PRN
Start: 2017-02-11 — End: 2017-02-11

## 2017-02-11 MED ORDER — OSELTAMIVIR PHOSPHATE 30 MG PO CAPS
30.0000 mg | ORAL_CAPSULE | Freq: Two times a day (BID) | ORAL | Status: DC
  Administered 2017-02-11: 30 mg via ORAL
  Filled 2017-02-11 (×2): qty 1

## 2017-02-11 MED ORDER — OSELTAMIVIR PHOSPHATE 30 MG PO CAPS: 30.0000 mg | ORAL_CAPSULE | Freq: Two times a day (BID) | ORAL | 0 refills | Status: AC

## 2017-02-11 MED ORDER — OSELTAMIVIR PHOSPHATE 30 MG PO CAPS: 30.0000 mg | ORAL_CAPSULE | Freq: Two times a day (BID) | ORAL | Status: DC

## 2017-02-11 MED ORDER — OXYBUTYNIN CHLORIDE 5 MG PO TABS
5.0000 mg | ORAL_TABLET | Freq: Two times a day (BID) | ORAL | Status: DC
  Administered 2017-02-11: 5 mg via ORAL
  Filled 2017-02-11: qty 1

## 2017-02-11 MED ORDER — WARFARIN SODIUM 5 MG PO TABS
5.0000 mg | ORAL_TABLET | Freq: Once | ORAL | Status: DC
  Filled 2017-02-11: qty 1

## 2017-02-11 MED ORDER — DONEPEZIL HCL 5 MG PO TABS
5.0000 mg | ORAL_TABLET | Freq: Every day | ORAL | Status: DC
  Administered 2017-02-11: 5 mg via ORAL
  Filled 2017-02-11: qty 1

## 2017-02-11 MED ORDER — OSELTAMIVIR PHOSPHATE 75 MG PO CAPS
75.0000 mg | ORAL_CAPSULE | Freq: Once | ORAL | Status: DC
Start: 2017-02-11 — End: 2017-02-11

## 2017-02-11 MED ORDER — LISINOPRIL 5 MG PO TABS
5.0000 mg | ORAL_TABLET | Freq: Every day | ORAL | Status: DC
  Administered 2017-02-11: 09:00:00 5 mg via ORAL
  Filled 2017-02-11: qty 1

## 2017-02-11 MED ORDER — ACETAMINOPHEN 500 MG PO TABS
1000.0000 mg | ORAL_TABLET | Freq: Once | ORAL | Status: AC
  Administered 2017-02-11: 1000 mg via ORAL

## 2017-02-11 MED ORDER — ONDANSETRON HCL 4 MG/2ML IJ SOLN: 4.0000 mg | Freq: Four times a day (QID) | INTRAMUSCULAR | Status: DC | PRN

## 2017-02-11 MED ORDER — WARFARIN SODIUM 4 MG PO TABS: 4.0000 mg | ORAL_TABLET | Freq: Every day | ORAL | Status: DC

## 2017-02-11 MED ORDER — MECLIZINE HCL 25 MG PO TABS
25.0000 mg | ORAL_TABLET | Freq: Three times a day (TID) | ORAL | Status: DC | PRN
  Filled 2017-02-11: qty 1

## 2017-02-11 MED ORDER — ACETAMINOPHEN 500 MG PO TABS
ORAL_TABLET | ORAL | Status: AC
  Filled 2017-02-11: qty 1

## 2017-02-11 MED ORDER — DILTIAZEM HCL ER COATED BEADS 120 MG PO CP24
120.0000 mg | ORAL_CAPSULE | Freq: Every day | ORAL | Status: DC
  Administered 2017-02-11: 09:00:00 120 mg via ORAL
  Filled 2017-02-11: qty 1

## 2017-02-11 MED ORDER — OSELTAMIVIR PHOSPHATE 75 MG PO CAPS: 75.0000 mg | ORAL_CAPSULE | Freq: Two times a day (BID) | ORAL | Status: DC

## 2017-02-11 MED ORDER — WARFARIN - PHARMACIST DOSING INPATIENT: Freq: Every day | Status: DC

## 2017-02-11 MED ORDER — SENNOSIDES-DOCUSATE SODIUM 8.6-50 MG PO TABS: 1.0000 | ORAL_TABLET | Freq: Every evening | ORAL | Status: DC | PRN

## 2017-02-11 MED ORDER — BENZONATATE 100 MG PO CAPS: 100.0000 mg | ORAL_CAPSULE | Freq: Three times a day (TID) | ORAL | 0 refills | Status: AC

## 2017-02-11 NOTE — Plan of Care (Signed)
  Progressing Spiritual Needs Ability to function at adequate level 02/11/2017 1309 - Progressing by Rowe Robert, RN Education: Knowledge of General Education information will improve 02/11/2017 1309 - Progressing by Rowe Robert, RN Health Behavior/Discharge Planning: Ability to manage health-related needs will improve 02/11/2017 1309 - Progressing by Rowe Robert, RN Clinical Measurements: Ability to maintain clinical measurements within normal limits will improve 02/11/2017 1309 - Progressing by Rowe Robert, RN Will remain free from infection 02/11/2017 1309 - Progressing by Rowe Robert, RN Diagnostic test results will improve 02/11/2017 1309 - Progressing by Rowe Robert, RN Respiratory complications will improve 02/11/2017 1309 - Progressing by Rowe Robert, RN Cardiovascular complication will be avoided 02/11/2017 1309 - Progressing by Rowe Robert, RN Activity: Risk for activity intolerance will decrease 02/11/2017 1309 - Progressing by Rowe Robert, RN Nutrition: Adequate nutrition will be maintained 02/11/2017 1309 - Progressing by Rowe Robert, RN Coping: Level of anxiety will decrease 02/11/2017 1309 - Progressing by Rowe Robert, RN Elimination: Will not experience complications related to bowel motility 02/11/2017 1309 - Progressing by Rowe Robert, RN Will not experience complications related to urinary retention 02/11/2017 1309 - Progressing by Rowe Robert, RN Pain Managment: General experience of comfort will improve 02/11/2017 1309 - Progressing by Rowe Robert, RN Safety: Ability to remain free from injury will improve 02/11/2017 1309 - Progressing by Rowe Robert, RN Skin Integrity: Risk for impaired skin integrity will decrease 02/11/2017 1309 - Progressing by Rowe Robert, RN

## 2017-02-11 NOTE — Progress Notes (Signed)
Patient request prayer. Prayer offered with Dionna and her family.     02/11/17 0700  Clinical Encounter Type  Visited With Patient  Visit Type Spiritual support  Spiritual Encounters  Spiritual Needs Prayer

## 2017-02-11 NOTE — Evaluation (Signed)
Physical Therapy Evaluation Patient Details Name: Jacqueline Larsen MRN: [id number] DOB: 02/25/1924 Today's Date: 02/11/2017   History of Present Illness  Pt admitted for flu. History includes Afib, breast cancer, mastectomy, HTN, DVT, arthritis. Pt with complaints of weakness, cough, and fever.   Clinical Impression  Pt is a pleasant 84 year old female who was admitted for flu. Pt performs bed mobility with mod assist, transfers with max assist, and ambulation with min assist. During ambulation, pt starts to buckle at knees, needing max assist to return to seated surface safely. Pt demonstrates deficits with strength/mobility/cognition. Family in room, educated on safe transfers at home. Family feels safe to take pt home and care for her. Would benefit from skilled PT to address above deficits and promote optimal return to PLOF. Recommend transition to [place] upon discharge from acute hospitalization.       Follow Up Recommendations Home health PT;Supervision/Assistance - 24 hour    Equipment Recommendations  None recommended by PT    Recommendations for Other Services       Precautions / Restrictions Precautions Precautions: Fall Restrictions Weight Bearing Restrictions: No      Mobility  Bed Mobility Overal bed mobility: Needs Assistance Bed Mobility: Sit to Supine       Sit to supine: Mod assist   General bed mobility comments: needs assist for positioning, +2 for sliding up in bed. Once supine in bed, pt had large BM, aide called for assistance  Transfers Overall transfer level: Needs assistance Equipment used: None Transfers: Sit to/from Stand Sit to Stand: Max assist         General transfer comment: attempted to stand without AD. Pt unable to fully come to standing. 2nd attempt with RW. Once standing, heavy post leaning noted with mod assist for balance  Ambulation/Gait Ambulation/Gait assistance: Min assist Ambulation Distance (Feet): 3 Feet Assistive  device: Rolling walker (2 wheeled) Gait Pattern/deviations: Step-to pattern     General Gait Details: slow step to gait pattern from chair->bed. Pt buckles and needs assist to sit on bed. Pt very lethargic and almost falls asleep while seated  Stairs            Wheelchair Mobility    Modified Rankin (Stroke Patients Only)       Balance Overall balance assessment: Needs assistance Sitting-balance support: Feet supported Sitting balance-Leahy Scale: Good     Standing balance support: Bilateral upper extremity supported Standing balance-Leahy Scale: Poor Standing balance comment: post leaning                             Pertinent Vitals/Pain Pain Assessment: No/denies pain    Home Living Family/patient expects to be discharged to:: Private residence Living Arrangements: Alone Available Help at Discharge: Family(daughter planning to stay with pt at discharge) Type of Home: House Home Access: Level entry     Home Layout: One level Home Equipment: Environmental consultant - 2 wheels;Walker - 4 wheels;Cane - single point;Toilet riser;Grab bars - toilet      Prior Function Level of Independence: Independent         Comments: occasionally uses RW for long distances     Hand Dominance        Extremity/Trunk Assessment   Upper Extremity Assessment Upper Extremity Assessment: Overall WFL for tasks assessed    Lower Extremity Assessment Lower Extremity Assessment: Generalized weakness(B LE grossly 3+/5)       Communication   Communication: No difficulties  Cognition Arousal/Alertness: Lethargic Behavior During Therapy: WFL for tasks assessed/performed Overall Cognitive Status: Within Functional Limits for tasks assessed                                        General Comments      Exercises     Assessment/Plan    PT Assessment Patient needs continued PT services  PT Problem List Decreased strength;Decreased activity  tolerance;Decreased balance;Decreased mobility       PT Treatment Interventions Gait training;Therapeutic exercise    PT Goals (Current goals can be found in the Care Plan section)  Acute Rehab PT Goals Patient Stated Goal: unable to state PT Goal Formulation: Patient unable to participate in goal setting Time For Goal Achievement: 02/25/17 Potential to Achieve Goals: Good    Frequency Min 2X/week   Barriers to discharge        Co-evaluation               AM-PAC PT "6 Clicks" Daily Activity  Outcome Measure Difficulty turning over in bed (including adjusting bedclothes, sheets and blankets)?: Unable Difficulty moving from lying on back to sitting on the side of the bed? : Unable Difficulty sitting down on and standing up from a chair with arms (e.g., wheelchair, bedside commode, etc,.)?: Unable Help needed moving to and from a bed to chair (including a wheelchair)?: A Lot Help needed walking in hospital room?: A Lot Help needed climbing 3-5 steps with a railing? : A Lot 6 Click Score: 9    End of Session Equipment Utilized During Treatment: Gait belt Activity Tolerance: Treatment limited secondary to medical complications (Comment) Patient left: in bed;with bed alarm set;with family/visitor present Nurse Communication: Mobility status PT Visit Diagnosis: Unsteadiness on feet (R26.81);Muscle weakness (generalized) (M62.81);History of falling (Z91.81);Difficulty in walking, not elsewhere classified (R26.2)    Time: 5397-6734 PT Time Calculation (min) (ACUTE ONLY): 21 min   Charges:   PT Evaluation $PT Eval Moderate Complexity: 1 Mod PT Treatments $Therapeutic Activity: 8-22 mins   PT G Codes:        Greggory Stallion, PT, DPT [phone]   Johari Bennetts 02/11/2017, 4:42 PM

## 2017-02-11 NOTE — ED Notes (Signed)
Got patient up to bedside of toilet to get urine sample

## 2017-02-11 NOTE — Care Management Note (Addendum)
Case Management Note  Patient Details  Name: Jacqueline Larsen MRN: [id number] Date of Birth: 1924/05/11  Subjective/Objective:   Admitted to [facility] with the diagnosis of flu. Lives alone,  Son is Girard Cooter [phone]). Last seen Dr, Kary Kos 07/26/16. Prescriptions are filled at [facility] in [place]. Home Health several years. Doesn't remember name of agency. No skilled nursing. No home oxygen, Life Alert, rolling walker, cane, raised toilet seat, and grab bars in the home. Takes care of all basic activities of daily living herself, doesn't drive anymore. Family helps with errands. Last fall was about a year ago. Good appetite. Family will transport              Action/Plan: Physical therapy evaluation pending. Would like [facility], if needed   Expected Discharge Date:  02/11/17               Expected Discharge Plan:     In-House Referral:   yes  Discharge planning Services   yes  Post Acute Care Choice:    Choice offered to:     DME Arranged:    DME Agency:     HH Arranged:   yes [place] Agency:   [facility]  Status of Service:     If discussed at [place] of Stay Meetings, dates discussed:    Additional Comments:  [name], RN MSN CCM Care Management [phone] 02/11/2017, 2:46 PM

## 2017-02-11 NOTE — Care Management CC44 (Signed)
Condition Code 44 Documentation Completed  Patient Details  Name: JAYELLE PAGE MRN: [id number] Date of Birth: December 25, 1924   Condition Code 44 given:  Yes Patient signature on Condition Code 44 notice:  Yes Documentation of 2 MD's agreement:  Yes Code 44 added to claim:  Yes    [name], RN 02/11/2017, 3:49 PM

## 2017-02-11 NOTE — H&P (Signed)
[facility] NAME: Jacqueline Larsen    MR#:  [id number]  DATE OF BIRTH:  Apr 14, 1924  DATE OF ADMISSION:  02/11/2017  PRIMARY CARE PHYSICIAN: Maryland Pink, MD   REQUESTING/REFERRING PHYSICIAN:   CHIEF COMPLAINT:   Chief Complaint  Patient presents with  . Fever  . Dizziness  . Cough    HISTORY OF PRESENT ILLNESS: Jacqueline Larsen  is a 84 y.o. female with a known history of atrial fibrillation, breast cancer, mastectomy, hypertension, skin cancer, DVT, arthritis presented to the emergency room with fever cough and generalized weakness.  Patient had a T-max of 64 F and also has cough nonproductive for 2 days.  She also had some periods of confusion.  Patient was evaluated in the emergency room her flu test came positive and she was given Tamiflu.  Patient lives alone at home and independent in activities of daily living.  Family lives close by.  PAST MEDICAL HISTORY:   Past Medical History:  Diagnosis Date  . Arthritis   . Atrial fibrillation ([place])   . Breast cancer ([place]) 2010   RT MASTECTOMY  . DVT (deep venous thrombosis) ([place])   . Heart disease   . HTN (hypertension)   . Skin cancer     PAST SURGICAL HISTORY:  Past Surgical History:  Procedure Laterality Date  . ABDOMINAL HYSTERECTOMY    . APPENDECTOMY    . FACIAL COSMETIC SURGERY     removed nodule off face  . MASTECTOMY Right    Breast cancer  . TONSILLECTOMY      SOCIAL HISTORY:  Social History   Tobacco Use  . Smoking status: Never Smoker  . Smokeless tobacco: Never Used  Substance Use Topics  . Alcohol use: No    FAMILY HISTORY:  Family History  Problem Relation Age of Onset  . Breast cancer Mother 82  . Breast cancer Daughter        30'S  . Kidney cancer Neg Hx   . Bladder Cancer Neg Hx     DRUG ALLERGIES:  Allergies  Allergen Reactions  . Clarithromycin Other (See Comments)  . Doxycycline Other (See Comments)  . Erythromycin  Other (See Comments)  . Sulindac Other (See Comments)    REVIEW OF SYSTEMS:   CONSTITUTIONAL: Has fever, fatigue and weakness.  EYES: No blurred or double vision.  EARS, NOSE, AND THROAT: No tinnitus or ear pain.  RESPIRATORY: No cough, shortness of breath, wheezing or hemoptysis.  CARDIOVASCULAR: No chest pain, orthopnea, edema.  GASTROINTESTINAL: No nausea, vomiting, diarrhea or abdominal pain.  GENITOURINARY: No dysuria, hematuria.  ENDOCRINE: No polyuria, nocturia,  HEMATOLOGY: No anemia, easy bruising or bleeding SKIN: No rash or lesion. MUSCULOSKELETAL: No joint pain or arthritis.   NEUROLOGIC: No tingling, numbness, weakness.  Periods of confusion PSYCHIATRY: No anxiety or depression.   MEDICATIONS AT HOME:  Prior to Admission medications   Medication Sig Start Date End Date Taking? Authorizing Provider  diltiazem (CARDIZEM CD) 120 MG 24 hr capsule Take 120 mg by mouth daily.  08/24/14  Yes [provider]  levothyroxine (SYNTHROID, LEVOTHROID) 50 MCG tablet TAKE 1 TABLET BY MOUTH ONCE A DAY 08/18/14  Yes [provider]  lisinopril (PRINIVIL,ZESTRIL) 5 MG tablet TAKE 1 TABLET BY MOUTH ONCE A DAY 08/05/14  Yes [provider]  meclizine (ANTIVERT) 25 MG tablet Take 25 mg by mouth 3 (three) times daily as needed for dizziness.    Yes [provider]  metoprolol succinate (TOPROL-XL) 25 MG 24 hr tablet Take 25 mg by mouth as needed (for aFib).  07/13/15  Yes [provider]  warfarin (COUMADIN) 4 MG tablet TAKE 1 TABLET BY MOUTH EVERY NIGHT 08/10/14  Yes [provider]  amoxicillin (AMOXIL) 500 MG capsule Take 1 capsule (500 mg total) by mouth 3 (three) times daily. Patient not taking: Reported on 06/25/2016 11/23/15   Paulette Blanch, MD  Cyanocobalamin (VITAMIN B-12) 1000 MCG SUBL Take by mouth.    [provider]  digoxin (LANOXIN) 0.125 MG tablet  10/03/14   [provider]  donepezil (ARICEPT) 5 MG tablet Take 1  tablet once daily 05/01/16   [provider]  metoprolol tartrate (LOPRESSOR) 25 MG tablet Take 25 mg by mouth.    [provider]  mirabegron ER (MYRBETRIQ) 50 MG TB24 tablet Take 1 tablet (50 mg total) by mouth daily. Patient not taking: Reported on 02/11/2017 07/22/16   Zara Council A, PA-C  oxybutynin (DITROPAN) 5 MG tablet Take by mouth. 03/11/16 03/11/17  [provider]      PHYSICAL EXAMINATION:   VITAL SIGNS: Blood pressure 134/83, pulse 90, temperature (!) 101.2 F (38.4 C), temperature source Oral, resp. rate (!) 25, height 5\' 7"  (1.702 m), weight 52.2 kg (115 lb), SpO2 94 %.  GENERAL:  84 y.o.-year-old patient lying in the bed with no acute distress.  EYES: Pupils equal, round, reactive to light and accommodation. No scleral icterus. Extraocular muscles intact.  HEENT: Head atraumatic, normocephalic. Oropharynx dry and nasopharynx clear.  NECK:  Supple, no jugular venous distention. No thyroid enlargement, no tenderness.  LUNGS: Normal breath sounds bilaterally, no wheezing, rales,rhonchi or crepitation. No use of accessory muscles of respiration.  CARDIOVASCULAR: S1, S2 tachycardia noted. No murmurs, rubs, or gallops.  ABDOMEN: Soft, nontender, nondistended. Bowel sounds present. No organomegaly or mass.  EXTREMITIES: No pedal edema, cyanosis, or clubbing.  NEUROLOGIC: Cranial nerves II through XII are intact. Muscle strength 5/5 in all extremities. Sensation intact. Gait not checked.  PSYCHIATRIC: The patient is alert and oriented x 3.  SKIN: No obvious rash, lesion, or ulcer.   LABORATORY PANEL:   CBC Recent Labs  Lab 02/11/17 0208  WBC 5.2  HGB 13.5  HCT 40.3  PLT 178  MCV 88.0  MCH 29.5  MCHC 33.6  RDW 13.9  LYMPHSABS 0.8*  MONOABS 1.0*  EOSABS 0.0  BASOSABS 0.0   ------------------------------------------------------------------------------------------------------------------  Chemistries  Recent Labs  Lab 02/11/17 0208  NA  135  K 3.9  CL 98*  CO2 25  GLUCOSE 105*  BUN 24*  CREATININE 1.02*  CALCIUM 9.3  AST 37  ALT 22  ALKPHOS 64  BILITOT 0.6   ------------------------------------------------------------------------------------------------------------------ estimated creatinine clearance is 30.2 mL/min (A) (by C-G formula based on SCr of 1.02 mg/dL (H)). ------------------------------------------------------------------------------------------------------------------ No results for input(s): TSH, T4TOTAL, T3FREE, THYROIDAB in the last 72 hours.  Invalid input(s): FREET3   Coagulation profile No results for input(s): INR, PROTIME in the last 168 hours. ------------------------------------------------------------------------------------------------------------------- No results for input(s): DDIMER in the last 72 hours. -------------------------------------------------------------------------------------------------------------------  Cardiac Enzymes No results for input(s): CKMB, TROPONINI, MYOGLOBIN in the last 168 hours.  Invalid input(s): CK ------------------------------------------------------------------------------------------------------------------ Invalid input(s): POCBNP  ---------------------------------------------------------------------------------------------------------------  Urinalysis    Component Value Date/Time   APPEARANCEUR Clear 07/22/2016 1618   GLUCOSEU Negative 07/22/2016 1618   BILIRUBINUR Negative 07/22/2016 1618   PROTEINUR 2+ (A) 07/22/2016 1618   NITRITE Negative 07/22/2016 1618   LEUKOCYTESUR Negative 07/22/2016 1618  RADIOLOGY: Dg Chest Portable 1 View  Result Date: 02/11/2017 CLINICAL DATA:  Acute onset of dizziness, confusion, cough and fever. EXAM: PORTABLE CHEST 1 VIEW COMPARISON:  Chest radiograph performed 11/23/2015 FINDINGS: The lungs are well-aerated and clear. There is no evidence of focal opacification, pleural effusion or pneumothorax.  The cardiomediastinal silhouette is within normal limits. No acute osseous abnormalities are seen. IMPRESSION: No acute cardiopulmonary process seen. Electronically Signed   By: Garald Balding M.D.   On: 02/11/2017 02:41    EKG: Orders placed or performed during the hospital encounter of 02/11/17  . ED EKG 12-Lead  . ED EKG 12-Lead    IMPRESSION AND PLAN: 84 year old female patient with history of arthritis, atrial fibrillation, DVT, hypertension, skin cancer, breast cancer presented to the emergency room with fever, cough and congestion.  Admitting diagnosis 1.  Flu syndrome 2.  Dehydration 3.  Chronic atrial fibrillation 4.  Hypertension Treatment plan Droplet precautions Oral Tamiflu IV fluids Resume Coumadin for anticoagulation Follow-up INR  All the records are reviewed and case discussed with ED provider. Management plans discussed with the patient, family and they are in agreement.  CODE STATUS:FULL CODE Code Status History    This patient does not have a recorded code status. Please follow your organizational policy for patients in this situation.       TOTAL TIME TAKING CARE OF THIS PATIENT: 50 minutes.    Saundra Shelling M.D on 02/11/2017 at 4:30 AM  Between 7am to 6pm - Pager - [phone]  After 6pm go to [email or web address] - password [email or web address] [facility]  [facility]  Office  [phone]  CC: Primary care physician; Maryland Pink, MD

## 2017-02-11 NOTE — Progress Notes (Signed)
ANTICOAGULATION CONSULT NOTE - Initial Consult  Pharmacy Consult for warfarin Indication: atrial fibrillation  Allergies  Allergen Reactions  . Clarithromycin Other (See Comments)  . Doxycycline Other (See Comments)  . Erythromycin Other (See Comments)  . Sulindac Other (See Comments)    Patient Measurements: Height: 5\' 7"  (170.2 cm) Weight: 115 lb (52.2 kg) IBW/kg (Calculated) : 61.6  Vital Signs: Temp: 98.1 F (36.7 C) (02/06 0836) Temp Source: Oral (02/06 0836) BP: 103/62 (02/06 0836) Pulse Rate: 74 (02/06 0836)  Labs: Recent Labs    02/11/17 0208 02/11/17 0613  HGB 13.5 12.6  HCT 40.3 37.5  PLT 178 161  LABPROT  --  17.9*  INR  --  1.49  CREATININE 1.02* 0.90    Estimated Creatinine Clearance: 34.2 mL/min (by C-G formula based on SCr of 0.9 mg/dL).   Medical History: Past Medical History:  Diagnosis Date  . Arthritis   . Atrial fibrillation ([place])   . Breast cancer ([place]) 2010   RT MASTECTOMY  . DVT (deep venous thrombosis) ([place])   . Heart disease   . HTN (hypertension)   . Skin cancer    Assessment: Pharmacy consulted to dose and monitor warfarin in this 84 year old female. Patient was taking warfarin PTA for atrial fibrillation.   Home dose: warfarin 4 mg PO daily  Dosing History Date INR Dose 2/6 1.5   Goal of Therapy:  INR 2-3 Monitor platelets by anticoagulation protocol: Yes   Plan:  Will give warfarin 5 mg PO this evening since INR is subtherapeutic. Will recheck INR with AM labs tomorrow.  Lenis Noon, PharmD, BCPS Clinical Pharmacist 02/11/2017,11:06 AM

## 2017-02-11 NOTE — ED Provider Notes (Signed)
[facility] Emergency Department Provider Note    First MD Initiated Contact with Patient 02/11/17 0159     (approximate)  I have reviewed the triage vital signs and the nursing notes.   HISTORY  Chief Complaint Fever; Dizziness; and Cough    HPI Jacqueline Larsen is a 84 y.o. female with below list of chronic medical conditions presents to the emergency department with 2 days of increasing confusion, nonproductive cough and fever with T-max at home 102.  Patient's daughter at bedside states that her mother has had increasing levels of confusion over the past 2 days.  Patient denies any discomfort at this time.   Past Medical History:  Diagnosis Date  . Arthritis   . Atrial fibrillation ([place])   . Breast cancer ([place]) 2010   RT MASTECTOMY  . DVT (deep venous thrombosis) ([place])   . Heart disease   . HTN (hypertension)   . Skin cancer     There are no active problems to display for this patient.   Past Surgical History:  Procedure Laterality Date  . ABDOMINAL HYSTERECTOMY    . APPENDECTOMY    . FACIAL COSMETIC SURGERY     removed nodule off face  . MASTECTOMY Right    Breast cancer  . TONSILLECTOMY      Prior to Admission medications   Medication Sig Start Date End Date Taking? Authorizing Provider  diltiazem (CARDIZEM CD) 120 MG 24 hr capsule Take 120 mg by mouth daily.  08/24/14  Yes [provider]  levothyroxine (SYNTHROID, LEVOTHROID) 50 MCG tablet TAKE 1 TABLET BY MOUTH ONCE A DAY 08/18/14  Yes [provider]  lisinopril (PRINIVIL,ZESTRIL) 5 MG tablet TAKE 1 TABLET BY MOUTH ONCE A DAY 08/05/14  Yes [provider]  meclizine (ANTIVERT) 25 MG tablet Take 25 mg by mouth 3 (three) times daily as needed for dizziness.    Yes [provider]  metoprolol succinate (TOPROL-XL) 25 MG 24 hr tablet Take 25 mg by mouth as needed (for aFib).  07/13/15  Yes [provider]  warfarin (COUMADIN) 4 MG tablet TAKE  1 TABLET BY MOUTH EVERY NIGHT 08/10/14  Yes [provider]  amoxicillin (AMOXIL) 500 MG capsule Take 1 capsule (500 mg total) by mouth 3 (three) times daily. Patient not taking: Reported on 06/25/2016 11/23/15   Paulette Blanch, MD  Cyanocobalamin (VITAMIN B-12) 1000 MCG SUBL Take by mouth.    [provider]  digoxin (LANOXIN) 0.125 MG tablet  10/03/14   [provider]  donepezil (ARICEPT) 5 MG tablet Take 1 tablet once daily 05/01/16   [provider]  metoprolol tartrate (LOPRESSOR) 25 MG tablet Take 25 mg by mouth.    [provider]  mirabegron ER (MYRBETRIQ) 50 MG TB24 tablet Take 1 tablet (50 mg total) by mouth daily. Patient not taking: Reported on 02/11/2017 07/22/16   Zara Council A, PA-C  oxybutynin (DITROPAN) 5 MG tablet Take by mouth. 03/11/16 03/11/17  [provider]    Allergies Clarithromycin; Doxycycline; Erythromycin; and Sulindac  Family History  Problem Relation Age of Onset  . Breast cancer Mother 57  . Breast cancer Daughter        30'S  . Kidney cancer Neg Hx   . Bladder Cancer Neg Hx     Social History Social History   Tobacco Use  . Smoking status: Never Smoker  . Smokeless tobacco: Never Used  Substance Use Topics  . Alcohol use: No  .  Drug use: No    Review of Systems Constitutional: Positive for fever/chills Eyes: No visual changes. ENT: No sore throat. Cardiovascular: Denies chest pain. Respiratory: Denies shortness of breath.  Positive for cough Gastrointestinal: No abdominal pain.  No nausea, no vomiting.  No diarrhea.  No constipation. Genitourinary: Negative for dysuria. Musculoskeletal: Negative for neck pain.  Negative for back pain. Integumentary: Negative for rash. Neurological: Negative for headaches, focal weakness or numbness.   ____________________________________________   PHYSICAL EXAM:  VITAL SIGNS: ED Triage Vitals  Enc Vitals Group     BP 02/11/17 0158 124/72     Pulse  Rate 02/11/17 0158 100     Resp 02/11/17 0158 20     Temp 02/11/17 0158 (!) 101.2 F (38.4 C)     Temp Source 02/11/17 0158 Oral     SpO2 02/11/17 0158 97 %     Weight 02/11/17 0156 52.2 kg (115 lb)     Height 02/11/17 0156 1.702 m (5\' 7" )     Head Circumference --      Peak Flow --      Pain Score 02/11/17 0330 0     Pain Loc --      Pain Edu? --      Excl. in [place]? --     Constitutional: Alert and oriented. Well appearing and in no acute distress. Eyes: Conjunctivae are normal. PERRL. EOMI. Head: Atraumatic. Nose: No congestion/rhinnorhea. Mouth/Throat: Mucous membranes are moist.  Oropharynx non-erythematous. Neck: No stridor.  No meningeal signs.  Cardiovascular: Normal rate, regular rhythm. Good peripheral circulation. Grossly normal heart sounds. Respiratory: Normal respiratory effort.  No retractions. Lungs CTAB. Gastrointestinal: Soft and nontender. No distention.  Musculoskeletal: No lower extremity tenderness nor edema. No gross deformities of extremities. Neurologic:  Normal speech and language. No gross focal neurologic deficits are appreciated.  Skin:  Skin is warm, dry and intact. No rash noted. Psychiatric: Mood and affect are normal. Speech and behavior are normal.  ____________________________________________   LABS (all labs ordered are listed, but only abnormal results are displayed)  Labs Reviewed  COMPREHENSIVE METABOLIC PANEL - Abnormal; Notable for the following components:      Result Value   Chloride 98 (*)    Glucose, Bld 105 (*)    BUN 24 (*)    Creatinine, Ser 1.02 (*)    GFR calc non Af Amer 47 (*)    GFR calc Af Amer 54 (*)    All other components within normal limits  CBC WITH DIFFERENTIAL/PLATELET - Abnormal; Notable for the following components:   Lymphs Abs 0.8 (*)    Monocytes Absolute 1.0 (*)    All other components within normal limits  INFLUENZA PANEL BY PCR (TYPE A & B) - Abnormal; Notable for the following components:   Influenza  A By PCR POSITIVE (*)    All other components within normal limits  CULTURE, BLOOD (ROUTINE X 2)  CULTURE, BLOOD (ROUTINE X 2)  LACTIC ACID, PLASMA  URINALYSIS, ROUTINE W REFLEX MICROSCOPIC   ____________________________________________  EKG  ED ECG REPORT I, [place] N Ahijah Devery, the attending physician, personally viewed and interpreted this ECG.   Date: 02/11/2017  EKG Time: 2:01 AM  Rate: 102  Rhythm: Sinus tachycardia  Axis: Normal  Intervals: Normal  ST&T Change: None  ____________________________________________  RADIOLOGY I,  Ernst Bowler, personally viewed and evaluated these images (plain radiographs) as part of my medical decision making, as well as reviewing the written report by the radiologist.  ED MD  interpretation: No acute findings noted on chest x-ray  Official radiology report(s): Dg Chest Portable 1 View  Result Date: 02/11/2017 CLINICAL DATA:  Acute onset of dizziness, confusion, cough and fever. EXAM: PORTABLE CHEST 1 VIEW COMPARISON:  Chest radiograph performed 11/23/2015 FINDINGS: The lungs are well-aerated and clear. There is no evidence of focal opacification, pleural effusion or pneumothorax. The cardiomediastinal silhouette is within normal limits. No acute osseous abnormalities are seen. IMPRESSION: No acute cardiopulmonary process seen. Electronically Signed   By: Garald Balding M.D.   On: 02/11/2017 02:41     Procedures   ____________________________________________   INITIAL IMPRESSION / ASSESSMENT AND PLAN / ED COURSE  As part of my medical decision making, I reviewed the following data within the electronic MEDICAL RECORD NUMBER8 year old female presented with above-stated history and physical exam of fever and nonproductive cough.  Patient noted to be tachycardic and febrile on arrival.  Concern for possible influenza versus pneumonia consider the possibility of sepsis given tachycardia and fever.  Laboratory data revealed positive for  influenza A.  Patient given Tamiflu in the emergency department.  Chest x-ray revealed no evidence of pneumonia.  Given clinical findings and confusion patient discussed with Dr. Estanislado Pandy for hospital admission for further evaluation and management ____________________________________________  FINAL CLINICAL IMPRESSION(S) / ED DIAGNOSES  Final diagnoses:  Influenza A  Altered mental status, unspecified altered mental status type     MEDICATIONS GIVEN DURING THIS VISIT:  Medications  oseltamivir (TAMIFLU) capsule 75 mg (not administered)  acetaminophen (TYLENOL) tablet 1,000 mg (1,000 mg Oral Given 02/11/17 0219)     ED Discharge Orders    None       Note:  This document was prepared using Dragon voice recognition software and may include unintentional dictation errors.    Gregor Hams, MD 02/11/17 [phone]

## 2017-02-11 NOTE — Discharge Summary (Signed)
[facility] [place] NAME: Jacqueline Larsen    MR#:  [id number]  DATE OF BIRTH:  1924/12/03  DATE OF ADMISSION:  02/11/2017   ADMITTING PHYSICIAN: Saundra Shelling, MD  DATE OF DISCHARGE: 02/11/17  PRIMARY CARE PHYSICIAN: Maryland Pink, MD   ADMISSION DIAGNOSIS:   Influenza A [J10.1] Altered mental status, unspecified altered mental status type [R41.82]  DISCHARGE DIAGNOSIS:   Active Problems:   Flu   SECONDARY DIAGNOSIS:   Past Medical History:  Diagnosis Date  . Arthritis   . Atrial fibrillation ([place])   . Breast cancer ([place]) 2010   RT MASTECTOMY  . DVT (deep venous thrombosis) ([place])   . Heart disease   . HTN (hypertension)   . Skin cancer     HOSPITAL COURSE:   84 year old female with past medical history significant for history of breast cancer status post mastectomy, atrial fibrillation and history of DVT on Coumadin, hypertension, arthritis who ambulates with a cane at home was brought in secondary to cough, fever and weakness.  #1 Influenza illness-causing high fevers and cough associated with weakness and confusion. -Patient received IV fluids, started on Tamiflu -Last temperature was 101.62F about 13 hours ago. She feels back to her baseline. -We will ambulate and if strong enough, will discharge her room as she has family who can take care of her. -Chest x-ray with no infiltrate. No superimposed pneumonia. Urine analysis with no infection.  2. A. fib-rate controlled. Continue Cardizem and digoxin. On warfarin for anticoagulation  3. Hypertension-continue lisinopril, toprol and Cardizem  4. Hypothyroidism-continue Synthroid  Daughter was updated at bedside and agreed with the plan. Advised to follow up with PCP within 1 week   DISCHARGE CONDITIONS:   Guarded  CONSULTS OBTAINED:   None  DRUG ALLERGIES:   Allergies  Allergen Reactions  . Clarithromycin Other (See Comments)  . Doxycycline Other (See  Comments)  . Erythromycin Other (See Comments)  . Sulindac Other (See Comments)   DISCHARGE MEDICATIONS:   Allergies as of 02/11/2017      Reactions   Clarithromycin Other (See Comments)   Doxycycline Other (See Comments)   Erythromycin Other (See Comments)   Sulindac Other (See Comments)      Medication List    STOP taking these medications   amoxicillin 500 MG capsule Commonly known as:  AMOXIL   mirabegron ER 50 MG Tb24 tablet Commonly known as:  MYRBETRIQ     TAKE these medications   benzonatate 100 MG capsule Commonly known as:  TESSALON Take 1 capsule (100 mg total) by mouth 3 (three) times daily.   digoxin 0.125 MG tablet Commonly known as:  LANOXIN   diltiazem 120 MG 24 hr capsule Commonly known as:  CARDIZEM CD Take 120 mg by mouth daily.   donepezil 5 MG tablet Commonly known as:  ARICEPT Take 1 tablet once daily   levothyroxine 50 MCG tablet Commonly known as:  SYNTHROID, LEVOTHROID TAKE 1 TABLET BY MOUTH ONCE A DAY   lisinopril 5 MG tablet Commonly known as:  PRINIVIL,ZESTRIL TAKE 1 TABLET BY MOUTH ONCE A DAY   meclizine 25 MG tablet Commonly known as:  ANTIVERT Take 25 mg by mouth 3 (three) times daily as needed for dizziness.   metoprolol succinate 25 MG 24 hr tablet Commonly known as:  TOPROL-XL Take 25 mg by mouth as needed (for aFib).   metoprolol tartrate 25 MG tablet Commonly known as:  LOPRESSOR Take 25 mg by mouth.  oseltamivir 30 MG capsule Commonly known as:  TAMIFLU Take 1 capsule (30 mg total) by mouth 2 (two) times daily for 5 days.   oxybutynin 5 MG tablet Commonly known as:  DITROPAN Take by mouth.   Vitamin B-12 1000 MCG Subl Take by mouth.   warfarin 4 MG tablet Commonly known as:  COUMADIN TAKE 1 TABLET BY MOUTH EVERY NIGHT        DISCHARGE INSTRUCTIONS:   1. PCP f/u in 1-2 weeks  DIET:   Cardiac diet  ACTIVITY:   Activity as tolerated  OXYGEN:   Home Oxygen: No.  Oxygen Delivery: room  air  DISCHARGE LOCATION:   home   If you experience worsening of your admission symptoms, develop shortness of breath, life threatening emergency, suicidal or homicidal thoughts you must seek medical attention immediately by calling 911 or calling your MD immediately  if symptoms less severe.  You Must read complete instructions/literature along with all the possible adverse reactions/side effects for all the Medicines you take and that have been prescribed to you. Take any new Medicines after you have completely understood and accpet all the possible adverse reactions/side effects.   Please note  You were cared for by a hospitalist during your hospital stay. If you have any questions about your discharge medications or the care you received while you were in the hospital after you are discharged, you can call the unit and asked to speak with the hospitalist on call if the hospitalist that took care of you is not available. Once you are discharged, your primary care physician will handle any further medical issues. Please note that NO REFILLS for any discharge medications will be authorized once you are discharged, as it is imperative that you return to your primary care physician (or establish a relationship with a primary care physician if you do not have one) for your aftercare needs so that they can reassess your need for medications and monitor your lab values.    On the day of Discharge:  VITAL SIGNS:   Blood pressure 103/62, pulse 74, temperature 98.1 F (36.7 C), temperature source Oral, resp. rate 18, height 5\' 7"  (1.702 m), weight 52.2 kg (115 lb), SpO2 94 %.  PHYSICAL EXAMINATION:    GENERAL:  84 y.o.-year-old elderly patient lying in the bed with no acute distress.  EYES: Pupils equal, round, reactive to light and accommodation. No scleral icterus. Extraocular muscles intact.  HEENT: Head atraumatic, normocephalic. Oropharynx and nasopharynx clear.  NECK:  Supple, no jugular  venous distention. No thyroid enlargement, no tenderness.  LUNGS: Normal breath sounds bilaterally, no wheezing, rales,rhonchi or crepitation. No use of accessory muscles of respiration. Decreased bibasilar breath sounds CARDIOVASCULAR: S1, S2 normal. No rubs, or gallops. 2/6 systolic murmur present ABDOMEN: Soft, non-tender, non-distended. Bowel sounds present. No organomegaly or mass.  EXTREMITIES: No pedal edema, cyanosis, or clubbing.  NEUROLOGIC: Cranial nerves II through XII are intact. Muscle strength 5/5 in all extremities. Sensation intact. Gait not checked.  PSYCHIATRIC: The patient is alert and oriented x 3.  SKIN: No obvious rash, lesion, or ulcer.   DATA REVIEW:   CBC Recent Labs  Lab 02/11/17 0613  WBC 4.3  HGB 12.6  HCT 37.5  PLT 161    Chemistries  Recent Labs  Lab 02/11/17 0208 02/11/17 0613  NA 135 137  K 3.9 3.6  CL 98* 102  CO2 25 25  GLUCOSE 105* 104*  BUN 24* 25*  CREATININE 1.02* 0.90  CALCIUM 9.3  9.0  AST 37  --   ALT 22  --   ALKPHOS 64  --   BILITOT 0.6  --      Microbiology Results  Results for orders placed or performed during the hospital encounter of 02/11/17  Blood Culture (routine x 2)     Status: None (Preliminary result)   Collection Time: 02/11/17  2:08 AM  Result Value Ref Range Status   Specimen Description BLOOD LT FOREARM  Final   Special Requests   Final    BOTTLES DRAWN AEROBIC AND ANAEROBIC Blood Culture adequate volume   Culture   Final    NO GROWTH < 12 HOURS Performed at [facility], [address] [place], [place] [address]    Report Status PENDING  Incomplete  Blood Culture (routine x 2)     Status: None (Preliminary result)   Collection Time: 02/11/17  2:08 AM  Result Value Ref Range Status   Specimen Description BLOOD RT [facility]  Final   Special Requests   Final    BOTTLES DRAWN AEROBIC AND ANAEROBIC Blood Culture adequate volume   Culture   Final    NO GROWTH < 12 HOURS Performed at [facility], [address] [place], [place] [address]    Report Status PENDING  Incomplete    RADIOLOGY:  Dg Chest Portable 1 View  Result Date: 02/11/2017 CLINICAL DATA:  Acute onset of dizziness, confusion, cough and fever. EXAM: PORTABLE CHEST 1 VIEW COMPARISON:  Chest radiograph performed 11/23/2015 FINDINGS: The lungs are well-aerated and clear. There is no evidence of focal opacification, pleural effusion or pneumothorax. The cardiomediastinal silhouette is within normal limits. No acute osseous abnormalities are seen. IMPRESSION: No acute cardiopulmonary process seen. Electronically Signed   By: Garald Balding M.D.   On: 02/11/2017 02:41     Management plans discussed with the patient, family and they are in agreement.  CODE STATUS:     Code Status Orders  (From admission, onward)        Start     Ordered   02/11/17 0523  Full code  Continuous     02/11/17 0523    Code Status History    Date Active Date Inactive Code Status Order ID Comments User Context   This patient has a current code status but no historical code status.      TOTAL TIME TAKING CARE OF THIS PATIENT: 38 minutes.    Jodelle Fausto M.D on 02/11/2017 at 1:02 PM  Between 7am to 6pm - Pager - [phone]  After 6pm go to [email or web address] - Proofreader  [employer]  Hospitalists  Office  [phone]  CC: Primary care physician; Maryland Pink, MD   Note: This dictation was prepared with Dragon dictation along with smaller phrase technology. Any transcriptional errors that result from this process are unintentional.

## 2017-02-11 NOTE — ED Triage Notes (Signed)
Pt to triage via w/c, mask in place; family reports pt dizzy several days with confusion, cough & fever (102)

## 2017-02-11 NOTE — Care Management Obs Status (Signed)
[facility] NOTIFICATION   Patient Details  Name: Jacqueline Larsen MRN: [id number] Date of Birth: 10/16/1924   Medicare Observation Status Notification Given:  YesDaughter signed    [name], RN 02/11/2017, 3:49 PM

## 2017-02-16 LAB — CULTURE, BLOOD (ROUTINE X 2)
CULTURE: NO GROWTH
Culture: NO GROWTH
SPECIAL REQUESTS: ADEQUATE
Special Requests: ADEQUATE

## 2017-06-10 ENCOUNTER — Ambulatory Visit
Admission: RE | Admit: 2017-06-10 | Discharge: 2017-06-10 | Disposition: A | Discharge status: Home / Self Care | Payer: Medicare Other | Source: Ambulatory Visit | Attending: Physician Assistant | Admitting: Physician Assistant

## 2017-06-10 ENCOUNTER — Other Ambulatory Visit: Payer: Self-pay | Admitting: Physician Assistant

## 2017-06-10 DIAGNOSIS — M79605 Pain in left leg: Secondary | ICD-10-CM | POA: Diagnosis not present

## 2017-06-10 DIAGNOSIS — R600 Localized edema: Secondary | ICD-10-CM | POA: Diagnosis not present

## 2017-06-10 DIAGNOSIS — M79662 Pain in left lower leg: Secondary | ICD-10-CM

## 2017-10-14 ENCOUNTER — Other Ambulatory Visit: Payer: Self-pay

## 2017-10-14 ENCOUNTER — Emergency Department: Payer: Medicare Other

## 2017-10-14 ENCOUNTER — Encounter: Payer: Self-pay | Admitting: *Deleted

## 2017-10-14 ENCOUNTER — Emergency Department
Admission: EM | Admit: 2017-10-14 | Discharge: 2017-10-14 | Disposition: A | Discharge status: Home / Self Care | Payer: Medicare Other | Attending: Emergency Medicine | Admitting: Emergency Medicine

## 2017-10-14 DIAGNOSIS — Z853 Personal history of malignant neoplasm of breast: Secondary | ICD-10-CM | POA: Insufficient documentation

## 2017-10-14 DIAGNOSIS — Z7901 Long term (current) use of anticoagulants: Secondary | ICD-10-CM | POA: Diagnosis not present

## 2017-10-14 DIAGNOSIS — I10 Essential (primary) hypertension: Secondary | ICD-10-CM | POA: Diagnosis not present

## 2017-10-14 DIAGNOSIS — R51 Headache: Secondary | ICD-10-CM | POA: Diagnosis not present

## 2017-10-14 DIAGNOSIS — H539 Unspecified visual disturbance: Secondary | ICD-10-CM | POA: Diagnosis present

## 2017-10-14 DIAGNOSIS — Z79899 Other long term (current) drug therapy: Secondary | ICD-10-CM | POA: Insufficient documentation

## 2017-10-14 DIAGNOSIS — Z85828 Personal history of other malignant neoplasm of skin: Secondary | ICD-10-CM | POA: Insufficient documentation

## 2017-10-14 DIAGNOSIS — G309 Alzheimer's disease, unspecified: Secondary | ICD-10-CM | POA: Insufficient documentation

## 2017-10-14 LAB — CBC WITH DIFFERENTIAL/PLATELET
ABS IMMATURE GRANULOCYTES: 0.02 10*3/uL (ref 0.00–0.07)
BASOS ABS: 0 10*3/uL (ref 0.0–0.1)
Basophils Relative: 1 %
Eosinophils Absolute: 0 10*3/uL (ref 0.0–0.5)
Eosinophils Relative: 1 %
HEMATOCRIT: 39.3 % (ref 36.0–46.0)
HEMOGLOBIN: 12.8 g/dL (ref 12.0–15.0)
IMMATURE GRANULOCYTES: 0 %
LYMPHS PCT: 32 %
Lymphs Abs: 2 10*3/uL (ref 0.7–4.0)
MCH: 29.6 pg (ref 26.0–34.0)
MCHC: 32.6 g/dL (ref 30.0–36.0)
MCV: 91 fL (ref 80.0–100.0)
Monocytes Absolute: 0.4 10*3/uL (ref 0.1–1.0)
Monocytes Relative: 7 %
NEUTROS ABS: 3.8 10*3/uL (ref 1.7–7.7)
NEUTROS PCT: 59 %
NRBC: 0 % (ref 0.0–0.2)
PLATELETS: 231 10*3/uL (ref 150–400)
RBC: 4.32 MIL/uL (ref 3.87–5.11)
RDW: 13 % (ref 11.5–15.5)
WBC: 6.3 10*3/uL (ref 4.0–10.5)

## 2017-10-14 LAB — DIGOXIN LEVEL: Digoxin Level: <0.2 ng/mL — ABNORMAL LOW (ref 0.8–2.0)

## 2017-10-14 LAB — URINALYSIS, COMPLETE (UACMP) WITH MICROSCOPIC
Bacteria, UA: NONE SEEN
Bilirubin Urine: NEGATIVE
GLUCOSE, UA: NEGATIVE mg/dL
Hgb urine dipstick: NEGATIVE
Ketones, ur: NEGATIVE mg/dL
Leukocytes, UA: NEGATIVE
Nitrite: NEGATIVE
Protein, ur: NEGATIVE mg/dL
SQUAMOUS EPITHELIAL / LPF: NONE SEEN (ref 0–5)
Specific Gravity, Urine: 1.004 — ABNORMAL LOW (ref 1.005–1.030)
pH: 8 (ref 5.0–8.0)

## 2017-10-14 LAB — PROTIME-INR
INR: 2.26
Prothrombin Time: 24.8 seconds — ABNORMAL HIGH (ref 11.4–15.2)

## 2017-10-14 LAB — BASIC METABOLIC PANEL
Anion gap: 7 (ref 5–15)
BUN: 18 mg/dL (ref 8–23)
CHLORIDE: 104 mmol/L (ref 98–111)
CO2: 29 mmol/L (ref 22–32)
CREATININE: 0.83 mg/dL (ref 0.44–1.00)
Calcium: 9.9 mg/dL (ref 8.9–10.3)
GFR calc Af Amer: >60 mL/min (ref >60)
GFR calc non Af Amer: 60 mL/min — ABNORMAL LOW (ref >60)
GLUCOSE: 103 mg/dL — AB (ref 70–99)
POTASSIUM: 3.8 mmol/L (ref 3.5–5.1)
Sodium: 140 mmol/L (ref 135–145)

## 2017-10-14 MED ORDER — TETRACAINE HCL 0.5 % OP SOLN
1.0000 [drp] | Freq: Once | OPHTHALMIC | Status: DC
  Filled 2017-10-14: qty 4

## 2017-10-14 NOTE — ED Notes (Signed)
Pt ambulatory to bathroom with one person assist. Family at bedside. NAD. Will continue to monitor for changes.

## 2017-10-14 NOTE — ED Notes (Signed)
Distant Vision:    Left eye:  20/100    Right eye:  20/70    Both eyes:  20/40  Near Vision:    Both eyes:  20/40

## 2017-10-14 NOTE — ED Notes (Signed)
Report given to Kelly, RN.

## 2017-10-14 NOTE — ED Triage Notes (Signed)
Pt to triage via wheelchair. Pt reports hearing crickets in her head.  Pt also reports visual changes recently.  Pt reports headaches.  Hx alz. Disease  Pt alert.

## 2017-10-14 NOTE — ED Provider Notes (Signed)
[facility] Emergency Department Provider Note  ____________________________________________   I have reviewed the triage vital signs and the nursing notes. Where available I have reviewed prior notes and, if possible and indicated, outside hospital notes.    HISTORY  Chief Complaint Eye Problem    HPI Jacqueline Larsen is a 84 y.o. female  History of DVT atrial fibrillation, breast cancer, hypertension skin cancer macular degeneration, vision issues, who is getting new glasses coming in the next week, history of dementia, history of cataract surgery but they cannot remember upon which I, she states on Wednesday she was trying to read something it was somewhat difficult to read.  It is hard to tell if it was harder than normal.  Seem like maybe she was seeing double.  Now she does not remember telling anyone that she feels that her vision is normal.  She has no other complaints no headache no stiff neck.  She states sometimes she has what seems like tinnitus but she is not having that now.  She states sometimes it is like she hears crickets.  She denies any focal numbness or weakness and family feel that she is completely at her baseline the preference would be to go home if possible.  Patient is followed by an ophthalmologist but not an ophthalmologist.  She has as noted above macular degeneration.    Past Medical History:  Diagnosis Date  . Arthritis   . Atrial fibrillation ([place])   . Breast cancer ([place]) 2010   RT MASTECTOMY  . DVT (deep venous thrombosis) ([place])   . Heart disease   . HTN (hypertension)   . Skin cancer     Patient Active Problem List   Diagnosis Date Noted  . Flu 02/11/2017    Past Surgical History:  Procedure Laterality Date  . ABDOMINAL HYSTERECTOMY    . APPENDECTOMY    . FACIAL COSMETIC SURGERY     removed nodule off face  . MASTECTOMY Right    Breast cancer  . TONSILLECTOMY      Prior to Admission medications   Medication  Sig Start Date End Date Taking? Authorizing Provider  benzonatate (TESSALON) 100 MG capsule Take 1 capsule (100 mg total) by mouth 3 (three) times daily. 02/11/17   Gladstone Lighter, MD  Cyanocobalamin (VITAMIN B-12) 1000 MCG SUBL Take by mouth.    [provider]  digoxin (LANOXIN) 0.125 MG tablet  10/03/14   [provider]  diltiazem (CARDIZEM CD) 120 MG 24 hr capsule Take 120 mg by mouth daily.  08/24/14   [provider]  donepezil (ARICEPT) 5 MG tablet Take 1 tablet once daily 05/01/16   [provider]  levothyroxine (SYNTHROID, LEVOTHROID) 50 MCG tablet TAKE 1 TABLET BY MOUTH ONCE A DAY 08/18/14   [provider]  lisinopril (PRINIVIL,ZESTRIL) 5 MG tablet TAKE 1 TABLET BY MOUTH ONCE A DAY 08/05/14   [provider]  meclizine (ANTIVERT) 25 MG tablet Take 25 mg by mouth 3 (three) times daily as needed for dizziness.     [provider]  metoprolol succinate (TOPROL-XL) 25 MG 24 hr tablet Take 25 mg by mouth as needed (for aFib).  07/13/15   [provider]  metoprolol tartrate (LOPRESSOR) 25 MG tablet Take 25 mg by mouth.    [provider]  warfarin (COUMADIN) 4 MG tablet TAKE 1 TABLET BY MOUTH EVERY NIGHT 08/10/14   [provider]    Allergies Clarithromycin; Doxycycline; Erythromycin; and Sulindac  Family History  Problem Relation Age of Onset  . Breast cancer Mother 2  . Breast cancer Daughter        4'S  . Kidney cancer Neg Hx   . Bladder Cancer Neg Hx     Social History Social History   Tobacco Use  . Smoking status: Never Smoker  . Smokeless tobacco: Never Used  Substance Use Topics  . Alcohol use: No  . Drug use: No    Review of Systems Constitutional: No fever/chills Eyes: No visual changes. ENT: No sore throat. No stiff neck no neck pain Cardiovascular: Denies chest pain. Respiratory: Denies shortness of breath. Gastrointestinal:   no vomiting.  No diarrhea.  No  constipation. Genitourinary: Negative for dysuria. Musculoskeletal: Negative lower extremity swelling Skin: Negative for rash. Neurological: Negative for severe headaches, focal weakness or numbness.   ____________________________________________   PHYSICAL EXAM:  VITAL SIGNS: ED Triage Vitals  Enc Vitals Group     BP 10/14/17 1657 134/67     Pulse Rate 10/14/17 1657 68     Resp 10/14/17 1657 20     Temp 10/14/17 1657 98.4 F (36.9 C)     Temp Source 10/14/17 1657 Oral     SpO2 10/14/17 1657 98 %     Weight 10/14/17 1658 120 lb (54.4 kg)     Height 10/14/17 1658 5\' 6"  (1.676 m)     Head Circumference --      Peak Flow --      Pain Score 10/14/17 1657 0     Pain Loc --      Pain Edu? --      Excl. in [place]? --     Constitutional: Alert and oriented mildly demented, unsure of the exact date somewhat poor historian. Well appearing and in no acute distress. Eyes: Conjunctivae are normal Head: Atraumatic HEENT: No congestion/rhinnorhea. Mucous membranes are moist.  Oropharynx non-erythematous Neck:   Nontender with no meningismus, no masses, no stridor Cardiovascular: Normal rate, regular rhythm. Grossly normal heart sounds.  Good peripheral circulation. Respiratory: Normal respiratory effort.  No retractions. Lungs CTAB. Abdominal: Soft and nontender. No distention. No guarding no rebound Back:  There is no focal tenderness or step off.  there is no midline tenderness there are no lesions noted. there is no CVA tenderness Musculoskeletal: No lower extremity tenderness, no upper extremity tenderness. No joint effusions, no DVT signs strong distal pulses no edema Neurologic:  Normal speech and language. No gross focal neurologic deficits are appreciated.  Skin:  Skin is warm, dry and intact. No rash noted. Psychiatric: Mood and affect are normal. Speech and behavior are normal.  ____________________________________________   LABS (all labs ordered are listed, but only abnormal  results are displayed)  Labs Reviewed  DIGOXIN LEVEL - Abnormal; Notable for the following components:      Result Value   Digoxin Level <0.2 (*)    All other components within normal limits  BASIC METABOLIC PANEL - Abnormal; Notable for the following components:   Glucose, Bld 103 (*)    GFR calc non Af Amer 60 (*)    All other components within normal limits  PROTIME-INR - Abnormal; Notable for the following components:   Prothrombin Time 24.8 (*)    All other components within normal limits  URINALYSIS, COMPLETE (UACMP) WITH MICROSCOPIC - Abnormal; Notable for the following components:   Color, Urine COLORLESS (*)    APPearance CLEAR (*)    Specific Gravity, Urine 1.004 (*)  All other components within normal limits  CBC WITH DIFFERENTIAL/PLATELET    Pertinent labs  results that were available during my care of the patient were reviewed by me and considered in my medical decision making (see chart for details). ____________________________________________  EKG  I personally interpreted any EKGs ordered by me or triage  ____________________________________________  RADIOLOGY  Pertinent labs & imaging results that were available during my care of the patient were reviewed by me and considered in my medical decision making (see chart for details). If possible, patient and/or family made aware of any abnormal findings.  Ct Head Wo Contrast  Result Date: 10/14/2017 CLINICAL DATA:  Visual changes. Severe headache. Hearing crickets. Alzheimer's. No reported injury. EXAM: CT HEAD WITHOUT CONTRAST TECHNIQUE: Contiguous axial images were obtained from the base of the skull through the vertex without intravenous contrast. COMPARISON:  11/21/2009 head CT. FINDINGS: Brain: No evidence of parenchymal hemorrhage or extra-axial fluid collection. No mass lesion, mass effect, or midline shift. No CT evidence of acute infarction. Generalized cerebral volume loss. Nonspecific prominent  subcortical and periventricular white matter hypodensity, most in keeping with chronic small vessel ischemic change. Cerebral ventricle sizes are stable and concordant with the degree of cerebral volume loss. Vascular: No acute abnormality. Skull: No evidence of calvarial fracture. Sinuses/Orbits: The visualized paranasal sinuses are essentially clear. Other:  The mastoid air cells are unopacified. IMPRESSION: 1.  No evidence of acute intracranial abnormality. 2. Generalized cerebral volume loss and prominent chronic small vessel ischemic changes in the cerebral white matter. Electronically Signed   By: Ilona Sorrel M.D.   On: 10/14/2017 17:24   ____________________________________________    PROCEDURES  Procedure(s) performed: None  Procedures  Critical Care performed: None  ____________________________________________   INITIAL IMPRESSION / ASSESSMENT AND PLAN / ED COURSE  Pertinent labs & imaging results that were available during my care of the patient were reviewed by me and considered in my medical decision making (see chart for details).  Patient here with a reported difficulty reading she is able to read out of her right eye just fine on her left eye she has some trouble focusing, but again she has macular degeneration and is not clear if this is her baseline or not.  3 days ago she mentions something about her vision and that is why she is here today.  She also told somebody that she was having a sensation of tinnitus but she denies that to me and she is neurologically intact.  I did do a diffuse work-up including CT scan of the head all of which is negative, her NIH stroke scale is 0 peripheral vision seems to be intact is when she has efforts to focus on the left eye that she has trouble.  The eye itself is not red inflamed or otherwise uncomfortable she states there is no ocular tenderness, she has no pain to palpation along the temporal artery.  She is therapeutic on her Coumadin.   There is no evidence of digitoxin toxicity from her blood work, I did talk to Dr. Neville Route of ophthalmology who feels that patient should be discharged and he will see her as an outpatient tomorrow in the morning, we will have her do that Is hard to tell if there is actually anything acute going on in this patient given her symptoms if there is it is been there for 4 days and at this time she is quite well-appearing.  She is able to ambulate with no difficulty.  We will  discharge her with close outpatient follow-up with ophthalmology tomorrow morning as it seems to be her primary complaint.  There is no evidence of acute CVA today can determine.  Patient and family very comfortable with this plan as a the patient has no complaints and be the family feels that she is at her baseline   ____________________________________________   FINAL CLINICAL IMPRESSION(S) / ED DIAGNOSES  Final diagnoses:  None      This chart was dictated using voice recognition software.  Despite best efforts to proofread,  errors can occur which can change meaning.      Schuyler Amor, MD 10/14/17 2218

## 2017-10-14 NOTE — Discharge Instructions (Addendum)
Is unclear why you have these visual disturbances, and I asked that you please go see your primary care doctor in the near future, the next few days.  More importantly, please call and go see the eye doctor tomorrow they are expecting your call.  Hopefully they can see you tomorrow as planned.  If you have numbness or weakness severe headache change in vision eye pain or other concerns return to the emergency department

## 2017-10-14 NOTE — ED Notes (Signed)
Wrong patient moved in error in Epic from the lobby to 18h and then to ED13.  Charting subsequently made on the wrong patient in error.  Orders discontinued and assessments to be removed from this patient.

## 2017-10-14 NOTE — ED Notes (Signed)
Brought by [facility] for vision changes since Sunday.

## 2019-02-06 ENCOUNTER — Encounter: Payer: Self-pay | Admitting: Emergency Medicine

## 2019-02-06 ENCOUNTER — Emergency Department
Admission: EM | Admit: 2019-02-06 | Discharge: 2019-02-06 | Disposition: A | Discharge status: Home / Self Care | Payer: Medicare Other | Attending: Emergency Medicine | Admitting: Emergency Medicine

## 2019-02-06 ENCOUNTER — Emergency Department: Payer: Medicare Other

## 2019-02-06 ENCOUNTER — Other Ambulatory Visit: Payer: Self-pay

## 2019-02-06 DIAGNOSIS — Z7984 Long term (current) use of oral hypoglycemic drugs: Secondary | ICD-10-CM | POA: Diagnosis not present

## 2019-02-06 DIAGNOSIS — Z85828 Personal history of other malignant neoplasm of skin: Secondary | ICD-10-CM | POA: Diagnosis not present

## 2019-02-06 DIAGNOSIS — S42201A Unspecified fracture of upper end of right humerus, initial encounter for closed fracture: Secondary | ICD-10-CM | POA: Diagnosis not present

## 2019-02-06 DIAGNOSIS — S4991XA Unspecified injury of right shoulder and upper arm, initial encounter: Secondary | ICD-10-CM | POA: Diagnosis present

## 2019-02-06 DIAGNOSIS — S2231XA Fracture of one rib, right side, initial encounter for closed fracture: Secondary | ICD-10-CM | POA: Insufficient documentation

## 2019-02-06 DIAGNOSIS — Y93E8 Activity, other personal hygiene: Secondary | ICD-10-CM | POA: Insufficient documentation

## 2019-02-06 DIAGNOSIS — Y92002 Bathroom of unspecified non-institutional (private) residence single-family (private) house as the place of occurrence of the external cause: Secondary | ICD-10-CM | POA: Insufficient documentation

## 2019-02-06 DIAGNOSIS — Z79899 Other long term (current) drug therapy: Secondary | ICD-10-CM | POA: Insufficient documentation

## 2019-02-06 DIAGNOSIS — W1811XA Fall from or off toilet without subsequent striking against object, initial encounter: Secondary | ICD-10-CM | POA: Diagnosis not present

## 2019-02-06 DIAGNOSIS — Y999 Unspecified external cause status: Secondary | ICD-10-CM | POA: Insufficient documentation

## 2019-02-06 DIAGNOSIS — G309 Alzheimer's disease, unspecified: Secondary | ICD-10-CM | POA: Diagnosis not present

## 2019-02-06 DIAGNOSIS — Z9011 Acquired absence of right breast and nipple: Secondary | ICD-10-CM | POA: Insufficient documentation

## 2019-02-06 DIAGNOSIS — I10 Essential (primary) hypertension: Secondary | ICD-10-CM | POA: Insufficient documentation

## 2019-02-06 DIAGNOSIS — Z853 Personal history of malignant neoplasm of breast: Secondary | ICD-10-CM | POA: Diagnosis not present

## 2019-02-06 HISTORY — DX: Dementia in other diseases classified elsewhere, unspecified severity, without behavioral disturbance, psychotic disturbance, mood disturbance, and anxiety: F02.80

## 2019-02-06 LAB — COMPREHENSIVE METABOLIC PANEL
ALT: 15 U/L (ref 0–44)
AST: 17 U/L (ref 15–41)
Albumin: 4 g/dL (ref 3.5–5.0)
Alkaline Phosphatase: 78 U/L (ref 38–126)
Anion gap: 9 (ref 5–15)
BUN: 19 mg/dL (ref 8–23)
CO2: 26 mmol/L (ref 22–32)
Calcium: 9.4 mg/dL (ref 8.9–10.3)
Chloride: 102 mmol/L (ref 98–111)
Creatinine, Ser: 0.79 mg/dL (ref 0.44–1.00)
GFR calc Af Amer: >60 mL/min (ref >60)
GFR calc non Af Amer: >60 mL/min (ref >60)
Glucose, Bld: 133 mg/dL — ABNORMAL HIGH (ref 70–99)
Potassium: 3.7 mmol/L (ref 3.5–5.1)
Sodium: 137 mmol/L (ref 135–145)
Total Bilirubin: 0.8 mg/dL (ref 0.3–1.2)
Total Protein: 7 g/dL (ref 6.5–8.1)

## 2019-02-06 LAB — CBC
HCT: 38.1 % (ref 36.0–46.0)
Hemoglobin: 12.5 g/dL (ref 12.0–15.0)
MCH: 29.3 pg (ref 26.0–34.0)
MCHC: 32.8 g/dL (ref 30.0–36.0)
MCV: 89.4 fL (ref 80.0–100.0)
Platelets: 235 10*3/uL (ref 150–400)
RBC: 4.26 MIL/uL (ref 3.87–5.11)
RDW: 13 % (ref 11.5–15.5)
WBC: 13.9 10*3/uL — ABNORMAL HIGH (ref 4.0–10.5)
nRBC: 0 % (ref 0.0–0.2)

## 2019-02-06 MED ORDER — MORPHINE SULFATE (PF) 2 MG/ML IV SOLN
1.0000 mg | Freq: Once | INTRAVENOUS | Status: AC
  Administered 2019-02-06: 21:00:00 1 mg via INTRAVENOUS
  Filled 2019-02-06: qty 1

## 2019-02-06 MED ORDER — OXYCODONE-ACETAMINOPHEN 5-325 MG PO TABS
1.0000 | ORAL_TABLET | Freq: Once | ORAL | Status: AC
  Administered 2019-02-06: 1 via ORAL
  Filled 2019-02-06: qty 1

## 2019-02-06 MED ORDER — LIDOCAINE 5 % EX PTCH
1.0000 | MEDICATED_PATCH | CUTANEOUS | Status: DC
  Administered 2019-02-06: 1 via TRANSDERMAL
  Filled 2019-02-06: qty 1

## 2019-02-06 MED ORDER — OXYCODONE-ACETAMINOPHEN 5-325 MG PO TABS: 1.0000 | ORAL_TABLET | Freq: Four times a day (QID) | ORAL | 0 refills | Status: AC | PRN

## 2019-02-06 NOTE — ED Provider Notes (Signed)
[facility] Emergency Department Provider Note  ____________________________________________  Time seen: Approximately 7:33 PM  I have reviewed the triage vital signs and the nursing notes.   HISTORY  Chief Complaint Shoulder Pain    HPI Jacqueline Larsen is a 86 y.o. female that presents to the emergency department for evaluation after fall today.  Patient was on the commode, reaching for her depends when she fell forward and landed on her right shoulder.  She has been unable to move her right shoulder since fall.  Patient is on Xarelto. Her daughter was with her when she fell. Patient denies headache, neck pain, chest pain, shortness of breath, abdominal pain, back pain.   Past Medical History:  Diagnosis Date  . Alzheimer's dementia ([place])   . Arthritis   . Atrial fibrillation ([place])   . Breast cancer ([place]) 2010   RT MASTECTOMY  . DVT (deep venous thrombosis) ([place])   . Heart disease   . HTN (hypertension)   . Skin cancer     Patient Active Problem List   Diagnosis Date Noted  . Flu 02/11/2017    Past Surgical History:  Procedure Laterality Date  . ABDOMINAL HYSTERECTOMY    . APPENDECTOMY    . FACIAL COSMETIC SURGERY     removed nodule off face  . MASTECTOMY Right    Breast cancer  . TONSILLECTOMY      Prior to Admission medications   Medication Sig Start Date End Date Taking? Authorizing Provider  benzonatate (TESSALON) 100 MG capsule Take 1 capsule (100 mg total) by mouth 3 (three) times daily. 02/11/17   Gladstone Lighter, MD  Cyanocobalamin (VITAMIN B-12) 1000 MCG SUBL Take by mouth.    [provider]  digoxin (LANOXIN) 0.125 MG tablet  10/03/14   [provider]  diltiazem (CARDIZEM CD) 120 MG 24 hr capsule Take 120 mg by mouth daily.  08/24/14   [provider]  donepezil (ARICEPT) 5 MG tablet Take 1 tablet once daily 05/01/16   [provider]  levothyroxine (SYNTHROID, LEVOTHROID) 50 MCG tablet TAKE  1 TABLET BY MOUTH ONCE A DAY 08/18/14   [provider]  lisinopril (PRINIVIL,ZESTRIL) 5 MG tablet TAKE 1 TABLET BY MOUTH ONCE A DAY 08/05/14   [provider]  meclizine (ANTIVERT) 25 MG tablet Take 25 mg by mouth 3 (three) times daily as needed for dizziness.     [provider]  metoprolol succinate (TOPROL-XL) 25 MG 24 hr tablet Take 25 mg by mouth as needed (for aFib).  07/13/15   [provider]  metoprolol tartrate (LOPRESSOR) 25 MG tablet Take 25 mg by mouth.    [provider]  oxyCODONE-acetaminophen (PERCOCET) 5-325 MG tablet Take 1 tablet by mouth every 6 (six) hours as needed for up to 3 days for severe pain. 02/06/19 02/09/19  Laban Emperor, PA-C  warfarin (COUMADIN) 4 MG tablet TAKE 1 TABLET BY MOUTH EVERY NIGHT 08/10/14   [provider]    Allergies Clarithromycin, Doxycycline, Erythromycin, and Sulindac  Family History  Problem Relation Age of Onset  . Breast cancer Mother 26  . Breast cancer Daughter        67'S  . Kidney cancer Neg Hx   . Bladder Cancer Neg Hx     Social History Social History   Tobacco Use  . Smoking status: Never Smoker  . Smokeless tobacco: Never Used  Substance Use Topics  . Alcohol use: No  . Drug use: No  Review of Systems  Cardiovascular: No chest pain. Respiratory: No cough. No SOB. Gastrointestinal: No abdominal pain.  No nausea, no vomiting.  Musculoskeletal: Positive for shoulder pain. Skin: Negative for rash, abrasions, lacerations. Positive for ecchymosis. Neurological: Negative for headaches, numbness or tingling   ____________________________________________   PHYSICAL EXAM:  VITAL SIGNS: ED Triage Vitals  Enc Vitals Group     BP 02/06/19 1840 (!) 180/97     Pulse Rate 02/06/19 1840 75     Resp 02/06/19 1840 16     Temp 02/06/19 1840 97.7 F (36.5 C)     Temp Source 02/06/19 1840 Oral     SpO2 02/06/19 1840 97 %     Weight 02/06/19 1837 130 lb (59 kg)      Height 02/06/19 1837 5\' 6"  (1.676 m)     Head Circumference --      Peak Flow --      Pain Score 02/06/19 1836 10     Pain Loc --      Pain Edu? --      Excl. in [place]? --      Constitutional: Alert and oriented. Well appearing and in no acute distress. Eyes: Conjunctivae are normal. PERRL. EOMI. Head: Atraumatic. ENT:      Ears:      Nose: No congestion/rhinnorhea.      Mouth/Throat: Mucous membranes are moist.  Neck: No stridor.  No cervical spine tenderness to palpation. Cardiovascular: Normal rate, regular rhythm.  Good peripheral circulation.  Symmetric radial pulses bilaterally. Respiratory: Normal respiratory effort without tachypnea or retractions. Lungs CTAB. Good air entry to the bases with no decreased or absent breath sounds. Gastrointestinal: Bowel sounds 4 quadrants. Soft and nontender to palpation. No guarding or rigidity. No palpable masses. No distention.  Musculoskeletal: Full range of motion to all extremities. No gross deformities appreciated.  Limited range of right shoulder due to pain.  Ecchymosis to proximal humerus. No tenderness to palpation or ecchymosis to chest wall. Neurologic:  Normal speech and language. No gross focal neurologic deficits are appreciated.  Skin:  Skin is warm, dry and intact. No rash noted. Psychiatric: Mood and affect are normal. Speech and behavior are normal. Patient exhibits appropriate insight and judgement.   ____________________________________________   LABS (all labs ordered are listed, but only abnormal results are displayed)  Labs Reviewed  CBC - Abnormal; Notable for the following components:      Result Value   WBC 13.9 (*)    All other components within normal limits  COMPREHENSIVE METABOLIC PANEL - Abnormal; Notable for the following components:   Glucose, Bld 133 (*)    All other components within normal limits   ____________________________________________  EKG  SR with  SA ____________________________________________  RADIOLOGY Robinette Haines, personally viewed and evaluated these images (plain radiographs) as part of my medical decision making, as well as reviewing the written report by the radiologist.  DG Ribs Unilateral W/Chest Right  Result Date: 02/06/2019 CLINICAL DATA:  Shoulder injury, fall landing on the right shoulder EXAM: RIGHT RIBS AND CHEST - 3+ VIEW COMPARISON:  Radiograph 02/11/2017 FINDINGS: Comminuted fracture of the right proximal humerus with slight varus angulation and likely fracture extension through the surgical neck and both tuberosities. Questionable angulation the right fourth rib laterally could reflect a minimally displaced fracture or remote deformity, poorly visualized. No associated pneumothorax or effusion. Coarse interstitial changes in the lungs are similar to comparison study. No consolidation or convincing features of edema. Cardiomediastinal contours are stable  with a calcified aorta. IMPRESSION: Comminuted fracture of the right proximal humerus. Questionable angulation the right fourth rib laterally could reflect a minimally displaced fracture or remote deformity, poorly visualized. No visible pneumothorax or effusion. No other acute cardiopulmonary abnormality. Aortic Atherosclerosis (ICD10-I70.0). Electronically Signed   By: Lovena Le M.D.   On: 02/06/2019 20:31   CT Head Wo Contrast  Result Date: 02/06/2019 CLINICAL DATA:  86 year old female status post fall at home 1630 hours today. Pain. EXAM: CT HEAD WITHOUT CONTRAST TECHNIQUE: Contiguous axial images were obtained from the base of the skull through the vertex without intravenous contrast. COMPARISON:  Head CT 10/24/2017. FINDINGS: Brain: Stable cerebral volume. No midline shift, mass effect, or evidence of intracranial mass lesion. Stable ventricle size and configuration. No acute intracranial hemorrhage identified. Stable gray-white matter differentiation throughout  the brain. Widespread chronic white matter hypodensity. No cortically based acute infarct identified. Vascular: Calcified atherosclerosis at the skull base. No suspicious intracranial vascular hyperdensity. Skull: No acute osseous abnormality identified. Sinuses/Orbits: Visualized paranasal sinuses and mastoids are clear. Other: No acute orbit or scalp soft tissue finding. IMPRESSION: 1. No acute intracranial abnormality or acute traumatic injury identified. 2. Stable non contrast CT appearance of the brain since 2019. Electronically Signed   By: Genevie Ann M.D.   On: 02/06/2019 21:02   CT Cervical Spine Wo Contrast  Result Date: 02/06/2019 CLINICAL DATA:  86 year old female status post fall at home 1630 hours today. Pain. EXAM: CT CERVICAL SPINE WITHOUT CONTRAST TECHNIQUE: Multidetector CT imaging of the cervical spine was performed without intravenous contrast. Multiplanar CT image reconstructions were also generated. COMPARISON:  Head CT today reported separately. Neck CT 08/13/2004. FINDINGS: Alignment: Preserved cervical lordosis. Cervicothoracic junction alignment is within normal limits. Bilateral posterior element alignment is within normal limits. Skull base and vertebrae: Osteopenia. Visualized skull base is intact. No atlanto-occipital dissociation. No acute osseous abnormality identified. Soft tissues and spinal canal: No prevertebral fluid or swelling. No visible canal hematoma. Negative noncontrast neck soft tissues. Disc levels: Widespread disc and endplate degeneration but fairly capacious cervical spinal canal, with no spinal stenosis suspected. Upper chest: Osteopenia. Visible upper thoracic levels appear grossly intact. Mild apical lung scarring. IMPRESSION: No acute traumatic injury identified in the cervical spine. Electronically Signed   By: Genevie Ann M.D.   On: 02/06/2019 21:06   DG Humerus Right  Result Date: 02/06/2019 CLINICAL DATA:  Injury, fall EXAM: RIGHT HUMERUS - 2+ VIEW COMPARISON:   Concurrent chest radiograph FINDINGS: There is an impacted, comminuted fracture of the proximal right humerus with involvement of the surgical neck and likely fracture extension into at least the greater tuberosity, possibly lesser tuberosity as well. Medial displacement and question some mild varus angulation. Associated shoulder effusion is noted with possible lipohemarthrosis and extensive overlying soft tissue swelling. Effusion appears to displace the humeral head slightly inferiorly. Remaining portions of the bony shoulder girdle are intact. Included portions of the chest wall are unremarkable. No distal humeral fractures. Alignment at the elbow is grossly maintained. IMPRESSION: Impacted, comminuted fracture of the proximal right humerus with involvement of the surgical neck and likely fracture extension into at least the greater tuberosity, possibly lesser tuberosity as well. Right shoulder effusion, possibly lipohemarthrosis, with inferior displacement of the humeral head. More diffuse soft tissue swelling is noted as well. Electronically Signed   By: Lovena Le M.D.   On: 02/06/2019 20:33    ____________________________________________    PROCEDURES  Procedure(s) performed:    Procedures  Medications  oxyCODONE-acetaminophen (PERCOCET/ROXICET) 5-325 MG per tablet 1 tablet (has no administration in time range)  lidocaine (LIDODERM) 5 % 1 patch (has no administration in time range)  morphine 2 MG/ML injection 1 mg (1 mg Intravenous Given 02/06/19 2101)     ____________________________________________   INITIAL IMPRESSION / ASSESSMENT AND PLAN / ED COURSE  Pertinent labs & imaging results that were available during my care of the patient were reviewed by me and considered in my medical decision making (see chart for details).  Review of the [place] CSRS was performed in accordance of the [place] prior to dispensing any controlled drugs.     Patient presented to emergency  department for evaluation of witnessed fall today.  Vital signs and exam are reassuring.  Head CT and cervical CT are negative for acute abnormalities.  Humerus x-ray is consistent with comminuted proximal humerus fracture.  Chest x-ray shows possible minimally displaced fracture of the fourth rib.  CBC remarkable for white blood cell count of 13.9, likely stress related anti-inflammatory from fall.  CMP remarkable for blood glucose of 133.  EKG shows sinus rhythm with arrhythmia, consistent with previous.  Humerus x-ray was discussed with Dr. Mack Guise, who states that this is nonsurgical and recommends that patient be placed in a shoulder immobilizer with follow-up with him in clinic as an outpatient.  Extensive conversation was had with patient's family about admission for pain control versus at home care.  Family strongly desires to take patient home and care for her there.  Patient has a very strong support system.  She has several family members that will help her ambulate to the commode and will be staying with her at her house.  Family declines admission for pain control.  Referral to social work was given.  Patient will be discharged home with prescriptions for Percocet.   Risks of Percocet were discussed with patient's family.  They will keep a close eye on her and will help her with all ambulation. Shoulder immobilizer was placed. Patient is to follow up with primary care and orthopedics as directed. Patient is given ED precautions to return to the ED for any worsening or new symptoms.     ____________________________________________  FINAL CLINICAL IMPRESSION(S) / ED DIAGNOSES Routine Final diagnoses:  Closed fracture of proximal end of right humerus, unspecified fracture morphology, initial encounter  Closed fracture of one rib of right side, initial encounter      NEW MEDICATIONS STARTED DURING THIS VISIT:  ED Discharge Orders         Ordered    oxyCODONE-acetaminophen (PERCOCET)  5-325 MG tablet  Every 6 hours PRN     02/06/19 2256              This chart was dictated using voice recognition software/Dragon. Despite best efforts to proofread, errors can occur which can change the meaning. Any change was purely unintentional.    Laban Emperor, PA-C 02/06/19 2341    Lavonia Drafts, MD 02/08/19 [phone]

## 2019-02-06 NOTE — Consult Note (Signed)
Called by Laban Emperor, PA regarding treatment for this 86 year old female who sustained a comminuted fracture of the left proximal humerus after a fall off the toilet at home.  Patient is on xarelto for a. Fib.  Patient is reported to have intact skin and to be neurovascularly intact by Ms. Wagner.  I have reviewed the humerus xrays.  I am recommending right shoulder immobilization with a sling or immobilizer.  She may follow up in the office within 1 week.  It is unlikely the patient will require surgery for this fracture.

## 2019-02-06 NOTE — ED Notes (Signed)
Daughter at bedside. Pt fell onto her right shoulder - pain with movement.

## 2019-02-06 NOTE — ED Notes (Signed)
Pt to xr 

## 2019-02-06 NOTE — ED Triage Notes (Addendum)
FIRST NURSE NOTE- here for fall.  Family reports pt fell landing on right shoulder. Pain with movement.  Pt was reaching for a depends while sitting on potty chair and fell.

## 2019-02-06 NOTE — ED Triage Notes (Addendum)
Pt had mechanical fall around 430pm at home and hit right shoulder which is now causing severe pain

## 2019-02-06 NOTE — Discharge Instructions (Addendum)
Please wear shoulder immobilizer.  You can take Percocet for pain.  I have put in a consult for social work so you should hear from them tomorrow.  Please call primary care and follow-up with primary care on Monday or Tuesday.  You can also ask primary care about home health.  Please call orthopedics tomorrow for a follow-up appointment.  Return to the emergency department for any worsening of symptoms.

## 2019-12-06 ENCOUNTER — Other Ambulatory Visit: Payer: Self-pay | Admitting: Infectious Diseases

## 2019-12-06 ENCOUNTER — Other Ambulatory Visit: Payer: Self-pay

## 2019-12-06 ENCOUNTER — Ambulatory Visit
Admission: RE | Admit: 2019-12-06 | Discharge: 2019-12-06 | Disposition: A | Discharge status: Home / Self Care | Payer: Medicare Other | Source: Ambulatory Visit | Attending: Infectious Diseases | Admitting: Infectious Diseases

## 2019-12-06 ENCOUNTER — Other Ambulatory Visit (HOSPITAL_COMMUNITY): Payer: Self-pay | Admitting: Infectious Diseases

## 2019-12-06 DIAGNOSIS — G8929 Other chronic pain: Secondary | ICD-10-CM | POA: Diagnosis present

## 2019-12-06 DIAGNOSIS — R519 Headache, unspecified: Secondary | ICD-10-CM

## 2020-01-07 DIAGNOSIS — U071 COVID-19: Secondary | ICD-10-CM

## 2020-01-07 HISTORY — DX: COVID-19: U07.1

## 2020-01-15 ENCOUNTER — Other Ambulatory Visit: Payer: Self-pay

## 2020-01-15 DIAGNOSIS — Z20822 Contact with and (suspected) exposure to covid-19: Secondary | ICD-10-CM

## 2020-01-17 LAB — SARS-COV-2, NAA 2 DAY TAT

## 2020-01-17 LAB — NOVEL CORONAVIRUS, NAA: SARS-CoV-2, NAA: DETECTED — AB

## 2020-01-18 ENCOUNTER — Encounter: Payer: Self-pay | Admitting: Nurse Practitioner

## 2020-01-18 ENCOUNTER — Telehealth: Payer: Self-pay | Admitting: Nurse Practitioner

## 2020-01-18 DIAGNOSIS — I4891 Unspecified atrial fibrillation: Secondary | ICD-10-CM | POA: Insufficient documentation

## 2020-01-18 DIAGNOSIS — I10 Essential (primary) hypertension: Secondary | ICD-10-CM | POA: Insufficient documentation

## 2020-01-18 NOTE — Telephone Encounter (Signed)
I called Jacqueline Larsen to discuss Covid symptoms and the use of Sotrovimab, a monoclonal antibody infusion for those with mild to moderate Covid symptoms and at a high risk of hospitalization.     Pt is qualified for this infusion at the monoclonal antibody infusion center due to co-morbid conditions and/or a member of an at-risk group.  Unfortunately, she has dementia and is cared for by her dtr in-law and son.  All three are currently ill with COVID (I spoke to dtr in law today, who is her primary caregiver), and [name] seems to be doing the best out of all three.  B/c of complexities of providing care for each other right now, dtr in law does not think it's feasible to get [name] treated today, which is her last day to qualify for mAb.  Symptoms tier reviewed as well as criteria for ending isolation.  Symptoms reviewed that would warrant ED/Hospital evaluation. Preventative practices reviewed. Patient verbalized understanding. Patient advised to call back if he decides that he does want to get infusion. Callback number to the infusion center given. Patient advised to go to Urgent care or ED with severe symptoms. Last date pt would be eligible for infusion is today.   Patient Active Problem List   Diagnosis Date Noted  . Atrial fibrillation ([place])   . HTN (hypertension)   . COVID-19 virus infection 01/2020  . Flu 02/11/2017    Murray Hodgkins, NP

## 2020-01-30 ENCOUNTER — Other Ambulatory Visit: Payer: Self-pay

## 2020-01-30 ENCOUNTER — Encounter: Payer: Self-pay | Admitting: Intensive Care

## 2020-01-30 ENCOUNTER — Emergency Department: Payer: Medicare HMO

## 2020-01-30 DIAGNOSIS — R531 Weakness: Secondary | ICD-10-CM | POA: Insufficient documentation

## 2020-01-30 DIAGNOSIS — R439 Unspecified disturbances of smell and taste: Secondary | ICD-10-CM | POA: Insufficient documentation

## 2020-01-30 DIAGNOSIS — H9190 Unspecified hearing loss, unspecified ear: Secondary | ICD-10-CM | POA: Diagnosis not present

## 2020-01-30 DIAGNOSIS — R4182 Altered mental status, unspecified: Secondary | ICD-10-CM | POA: Insufficient documentation

## 2020-01-30 DIAGNOSIS — Z5321 Procedure and treatment not carried out due to patient leaving prior to being seen by health care provider: Secondary | ICD-10-CM | POA: Diagnosis not present

## 2020-01-30 LAB — CBC
HCT: 38.9 % (ref 36.0–46.0)
Hemoglobin: 13 g/dL (ref 12.0–15.0)
MCH: 29.5 pg (ref 26.0–34.0)
MCHC: 33.4 g/dL (ref 30.0–36.0)
MCV: 88.4 fL (ref 80.0–100.0)
Platelets: 375 10*3/uL (ref 150–400)
RBC: 4.4 MIL/uL (ref 3.87–5.11)
RDW: 12.7 % (ref 11.5–15.5)
WBC: 7.3 10*3/uL (ref 4.0–10.5)
nRBC: 0 % (ref 0.0–0.2)

## 2020-01-30 LAB — BASIC METABOLIC PANEL
Anion gap: 15 (ref 5–15)
BUN: 28 mg/dL — ABNORMAL HIGH (ref 8–23)
CO2: 25 mmol/L (ref 22–32)
Calcium: 10.4 mg/dL — ABNORMAL HIGH (ref 8.9–10.3)
Chloride: 100 mmol/L (ref 98–111)
Creatinine, Ser: 0.82 mg/dL (ref 0.44–1.00)
GFR, Estimated: >60 mL/min (ref >60)
Glucose, Bld: 104 mg/dL — ABNORMAL HIGH (ref 70–99)
Potassium: 3.5 mmol/L (ref 3.5–5.1)
Sodium: 140 mmol/L (ref 135–145)

## 2020-01-30 LAB — TROPONIN I (HIGH SENSITIVITY)
Troponin I (High Sensitivity): 6 ng/L (ref <18)
Troponin I (High Sensitivity): 8 ng/L (ref <18)

## 2020-01-30 NOTE — ED Triage Notes (Addendum)
Patient diagnosed with covid January 15, 2020. A week ago patient quit eating/drinking due to loss of taste and smell, increased AMS, weakness, no longer ambulating. Baseline was more interactive and talkative, could ambulate with walker, and no problems eating. Patient is hard of hearing.

## 2020-01-31 ENCOUNTER — Emergency Department
Admission: EM | Admit: 2020-01-31 | Discharge: 2020-01-31 | Disposition: A | Discharge status: Home / Self Care | Payer: Medicare HMO | Attending: Emergency Medicine | Admitting: Emergency Medicine

## 2020-06-06 DEATH — deceased

## 2022-12-20 IMAGING — CT CT HEAD W/O CM
3 series · 16 of 47 positions shown, 19 images · non-contrast
Comparison: Head CT dated 12/06/2019.

CLINICAL DATA: [AGE] female with dizziness.

EXAM:
CT HEAD WITHOUT CONTRAST
TECHNIQUE: Contiguous axial images were obtained from the base of the skull
through the vertex without intravenous contrast.

[Series 2: head wo · axial · 0.42mm/px · z∈[+289,+424]mm · 10 of 33 slices shown, 13 images]
[im 3/33  brain]
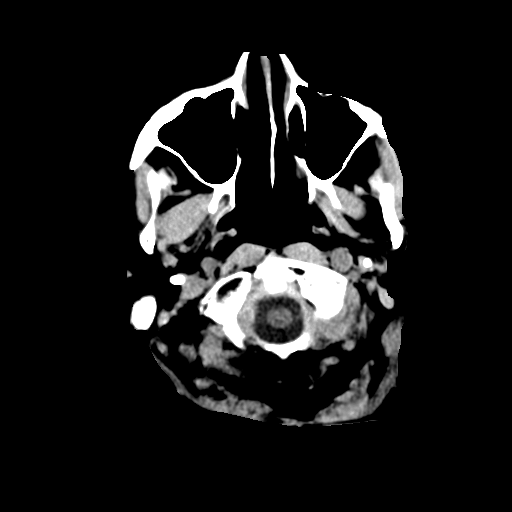
[im 3/33  bone]
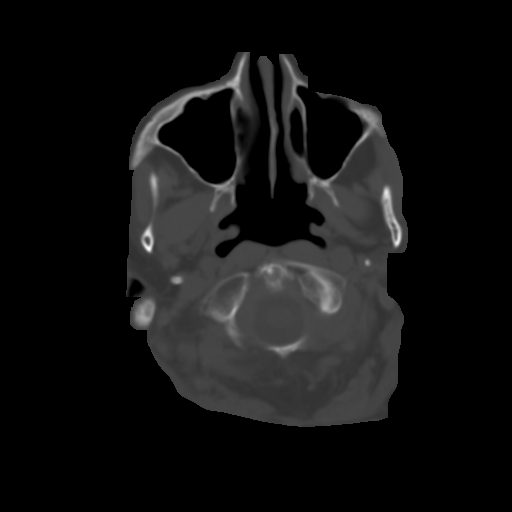
[im 6/33  brain]
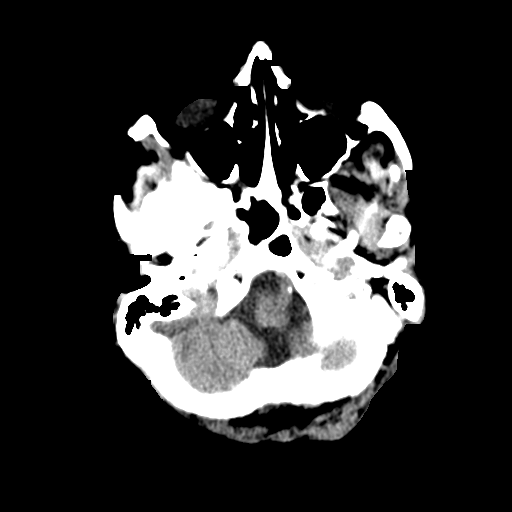
[im 9/33  brain]
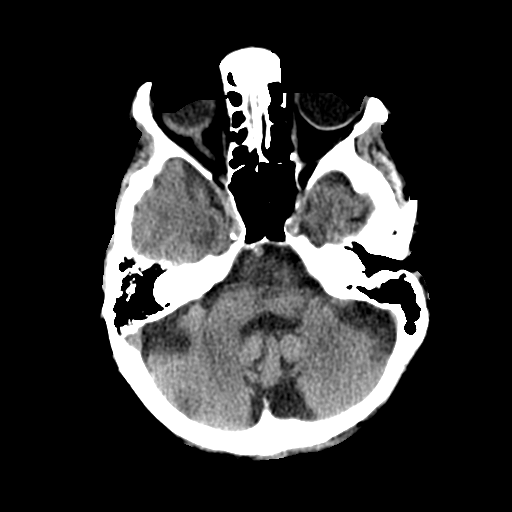
[im 12/33  brain]
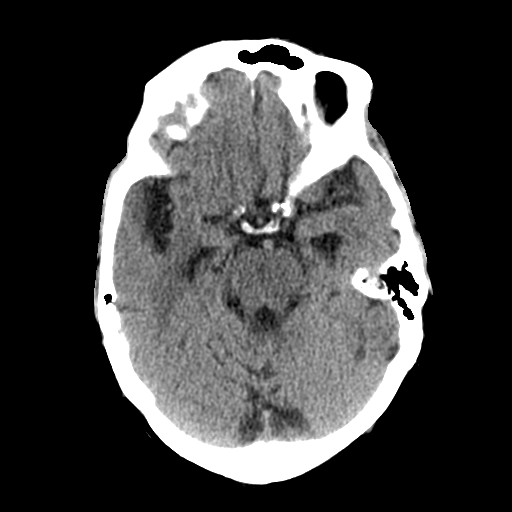
[im 15/33  brain]
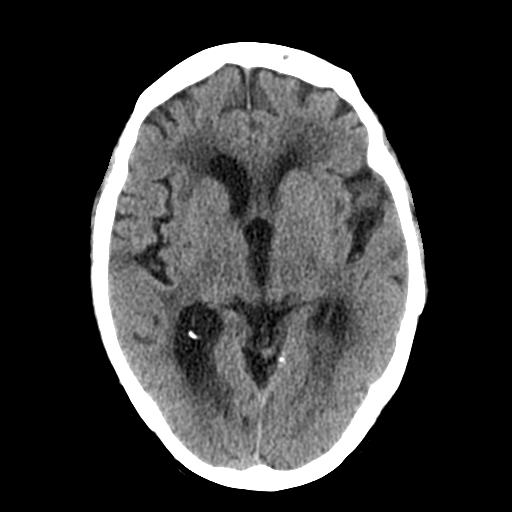
[im 15/33  bone]
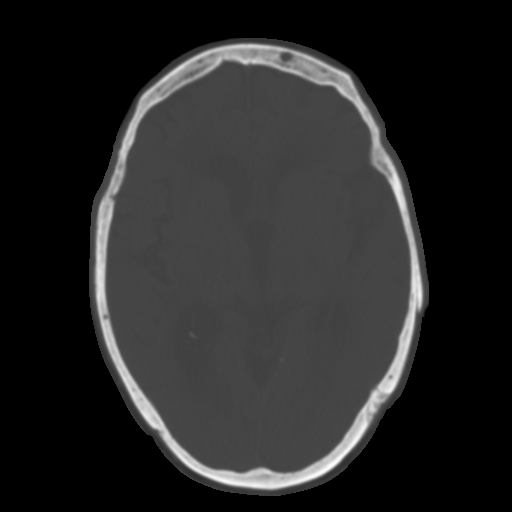
[im 18/33  brain]
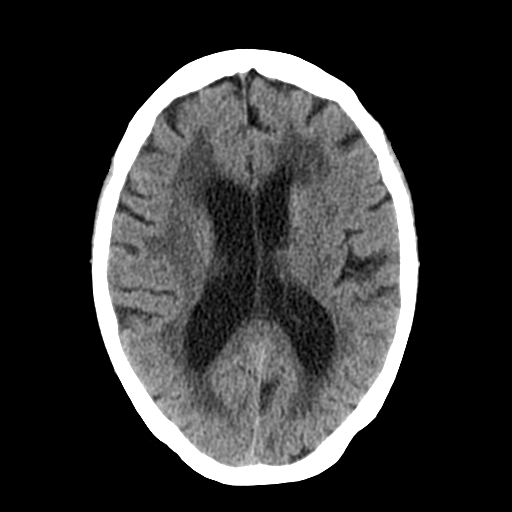
[im 21/33  brain]
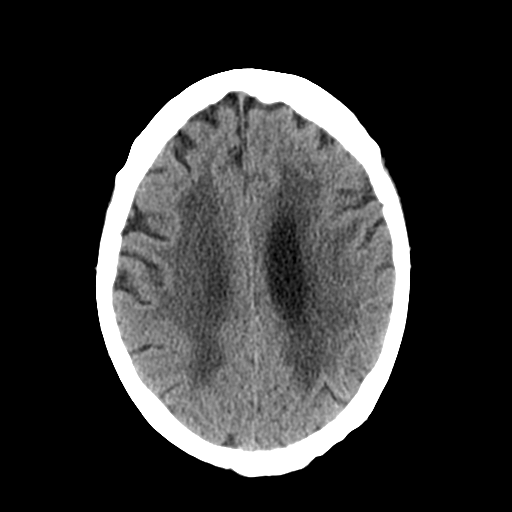
[im 25/33  brain]
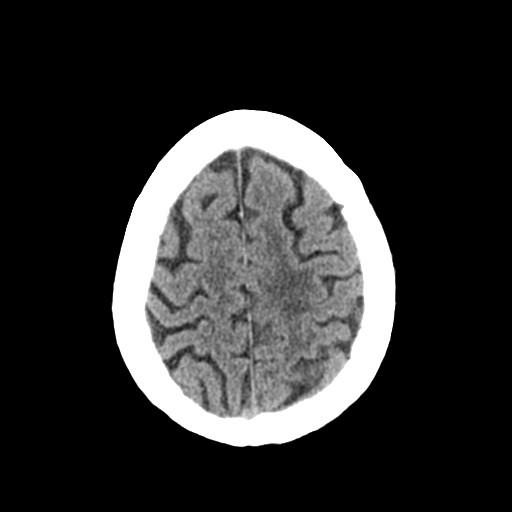
[im 27/33  brain]
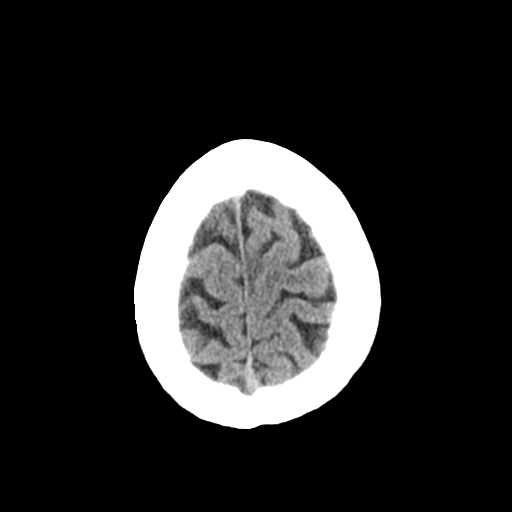
[im 27/33  bone]
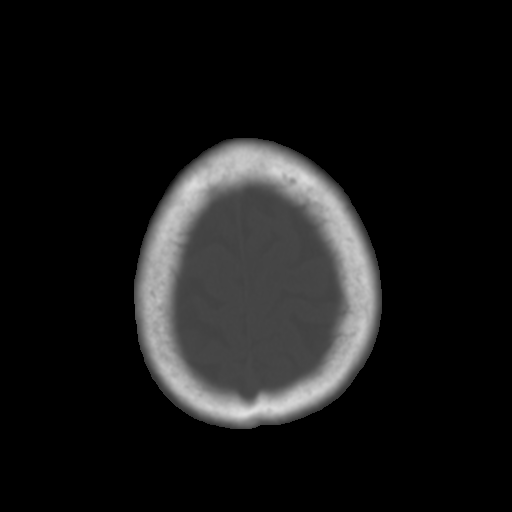
[im 30/33  brain]
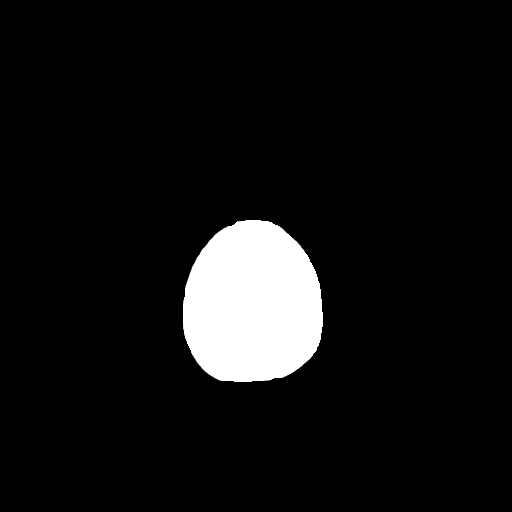

[Series 4: coronal soft tissue · coronal · 0.32mm/px · 3 of 69 slices shown]
[im 23/69  brain]
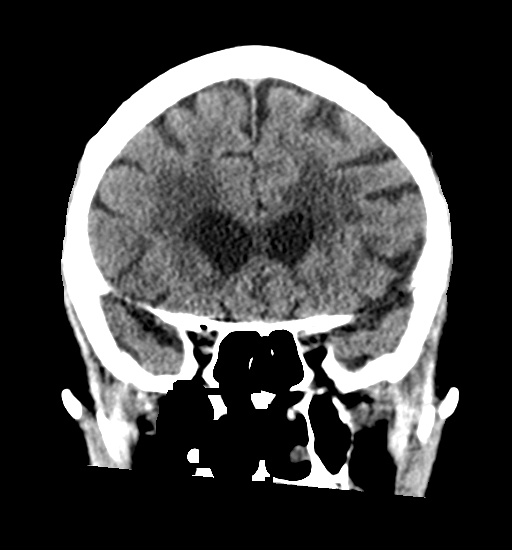
[im 31/69  brain]
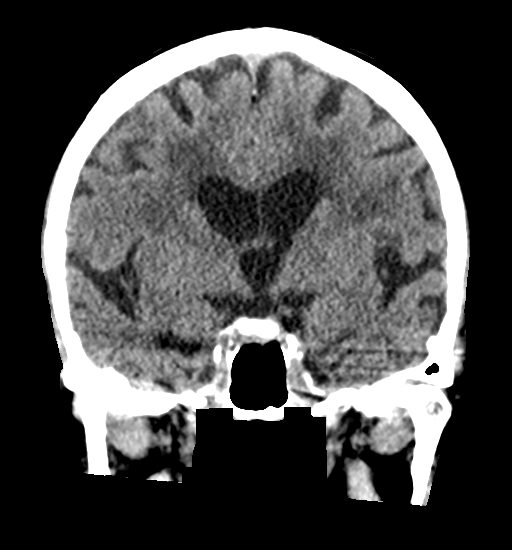
[im 38/69  brain]
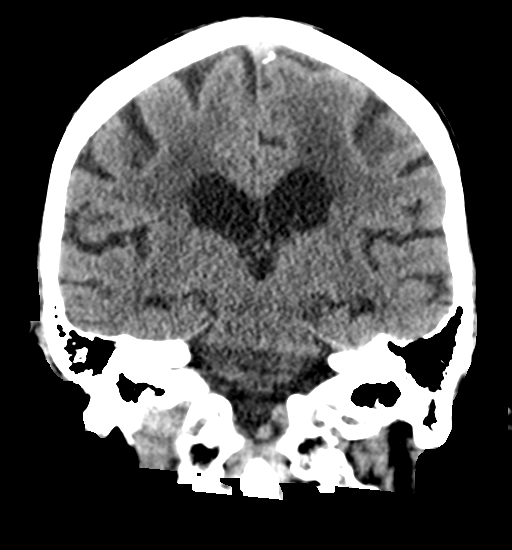

[Series 5: sagittal soft tissue · sagittal · 0.35mm/px · 3 of 51 slices shown]
[im 17/51  brain]
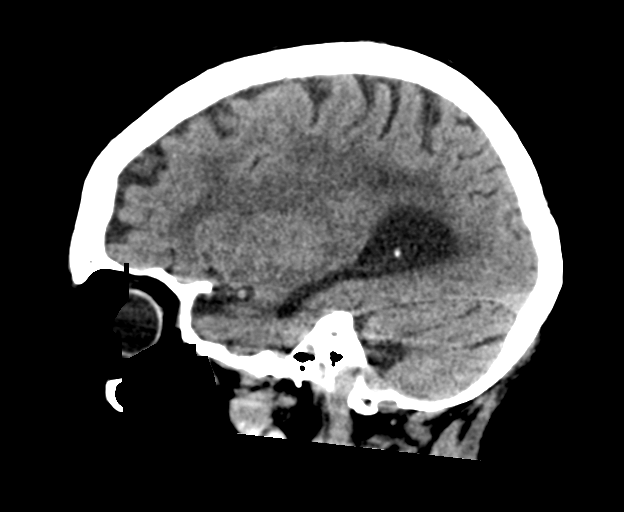
[im 26/51  brain]
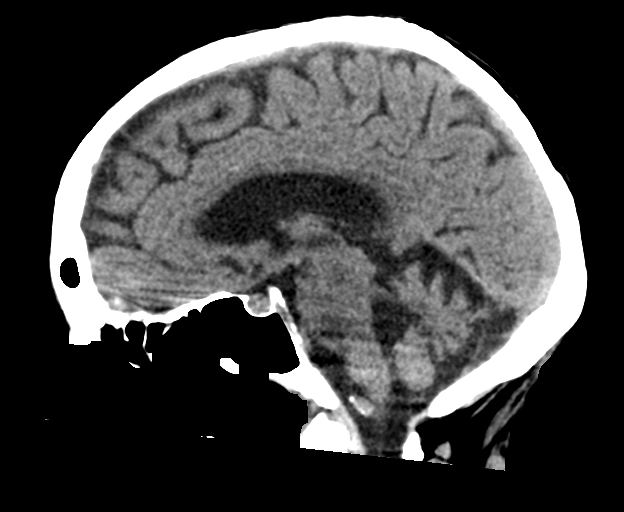
[im 34/51  brain]
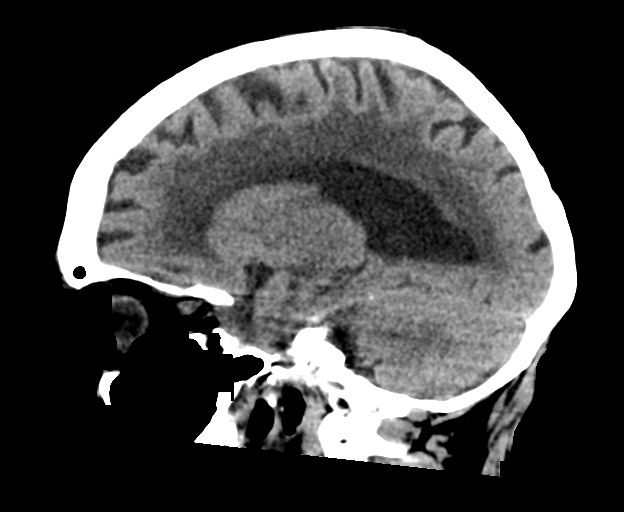

[16 of 47 positions shown; findings below may reference images not displayed]

FINDINGS: Brain: Moderate age-related atrophy and chronic microvascular
ischemic changes. There is no acute intracranial hemorrhage. No mass
effect or midline shift no extra-axial fluid collection.

Vascular: No hyperdense vessel or unexpected calcification.

Skull: Normal. Negative for fracture or focal lesion.

Sinuses/Orbits: No acute finding.

Other: None
IMPRESSION: 1. No acute intracranial pathology.
2. Moderate age-related atrophy and chronic microvascular ischemic
changes.
